# Patient Record
Sex: Male | Born: 1970 | Race: White | Hispanic: No | Smoking: Former smoker
Health system: Southern US, Community
[De-identification: ages and names within clinical notes are randomized; demographics above are authoritative.]

## PROBLEM LIST (undated history)

## (undated) DIAGNOSIS — G8929 Other chronic pain: Secondary | ICD-10-CM

## (undated) DIAGNOSIS — F32A Depression, unspecified: Secondary | ICD-10-CM

## (undated) DIAGNOSIS — M1990 Unspecified osteoarthritis, unspecified site: Secondary | ICD-10-CM

## (undated) DIAGNOSIS — K219 Gastro-esophageal reflux disease without esophagitis: Secondary | ICD-10-CM

## (undated) DIAGNOSIS — J45909 Unspecified asthma, uncomplicated: Secondary | ICD-10-CM

## (undated) DIAGNOSIS — M25512 Pain in left shoulder: Secondary | ICD-10-CM

## (undated) DIAGNOSIS — F329 Major depressive disorder, single episode, unspecified: Secondary | ICD-10-CM

## (undated) DIAGNOSIS — IMO0002 Reserved for concepts with insufficient information to code with codable children: Secondary | ICD-10-CM

## (undated) DIAGNOSIS — T7840XA Allergy, unspecified, initial encounter: Secondary | ICD-10-CM

## (undated) DIAGNOSIS — F419 Anxiety disorder, unspecified: Secondary | ICD-10-CM

## (undated) HISTORY — DX: Unspecified asthma, uncomplicated: J45.909

## (undated) HISTORY — PX: KNEE SURGERY: SHX244

## (undated) HISTORY — PX: SHOULDER SURGERY: SHX246

## (undated) HISTORY — DX: Major depressive disorder, single episode, unspecified: F32.9

## (undated) HISTORY — DX: Anxiety disorder, unspecified: F41.9

## (undated) HISTORY — DX: Gastro-esophageal reflux disease without esophagitis: K21.9

## (undated) HISTORY — DX: Unspecified osteoarthritis, unspecified site: M19.90

## (undated) HISTORY — DX: Allergy, unspecified, initial encounter: T78.40XA

## (undated) HISTORY — DX: Pain in left shoulder: M25.512

## (undated) HISTORY — DX: Other chronic pain: G89.29

## (undated) HISTORY — DX: Reserved for concepts with insufficient information to code with codable children: IMO0002

## (undated) HISTORY — DX: Depression, unspecified: F32.A

---

## 2001-07-25 ENCOUNTER — Encounter: Payer: Self-pay | Admitting: Orthopedic Surgery

## 2001-07-25 ENCOUNTER — Ambulatory Visit (HOSPITAL_COMMUNITY): Admission: RE | Admit: 2001-07-25 | Discharge: 2001-07-25 | Payer: Self-pay | Admitting: Orthopedic Surgery

## 2001-10-06 ENCOUNTER — Ambulatory Visit (HOSPITAL_BASED_OUTPATIENT_CLINIC_OR_DEPARTMENT_OTHER): Admission: RE | Admit: 2001-10-06 | Discharge: 2001-10-06 | Payer: Self-pay | Admitting: Orthopedic Surgery

## 2003-08-05 ENCOUNTER — Emergency Department (HOSPITAL_COMMUNITY): Admission: EM | Admit: 2003-08-05 | Discharge: 2003-08-05 | Payer: Self-pay | Admitting: Emergency Medicine

## 2004-12-01 ENCOUNTER — Ambulatory Visit: Payer: Self-pay | Admitting: Internal Medicine

## 2004-12-04 ENCOUNTER — Ambulatory Visit (HOSPITAL_COMMUNITY): Admission: RE | Admit: 2004-12-04 | Discharge: 2004-12-04 | Payer: Self-pay | Admitting: Internal Medicine

## 2004-12-12 ENCOUNTER — Ambulatory Visit: Payer: Self-pay | Admitting: Internal Medicine

## 2004-12-12 ENCOUNTER — Ambulatory Visit (HOSPITAL_COMMUNITY): Admission: RE | Admit: 2004-12-12 | Discharge: 2004-12-12 | Payer: Self-pay | Admitting: Internal Medicine

## 2004-12-18 ENCOUNTER — Ambulatory Visit: Payer: Self-pay | Admitting: Internal Medicine

## 2005-01-11 ENCOUNTER — Ambulatory Visit: Payer: Self-pay | Admitting: Internal Medicine

## 2005-06-29 ENCOUNTER — Ambulatory Visit: Payer: Self-pay | Admitting: Internal Medicine

## 2005-08-21 ENCOUNTER — Ambulatory Visit (HOSPITAL_COMMUNITY): Admission: RE | Admit: 2005-08-21 | Discharge: 2005-08-21 | Payer: Self-pay | Admitting: Orthopedic Surgery

## 2005-11-09 ENCOUNTER — Ambulatory Visit: Payer: Self-pay | Admitting: Internal Medicine

## 2005-11-19 ENCOUNTER — Ambulatory Visit: Payer: Self-pay | Admitting: Internal Medicine

## 2005-12-18 ENCOUNTER — Emergency Department (HOSPITAL_COMMUNITY): Admission: EM | Admit: 2005-12-18 | Discharge: 2005-12-18 | Payer: Self-pay | Admitting: Emergency Medicine

## 2006-02-26 HISTORY — PX: SHOULDER SURGERY: SHX246

## 2006-03-29 ENCOUNTER — Ambulatory Visit: Payer: Self-pay | Admitting: Internal Medicine

## 2006-04-04 ENCOUNTER — Ambulatory Visit: Payer: Self-pay | Admitting: Internal Medicine

## 2006-08-21 ENCOUNTER — Emergency Department (HOSPITAL_COMMUNITY): Admission: EM | Admit: 2006-08-21 | Discharge: 2006-08-21 | Payer: Self-pay | Admitting: Emergency Medicine

## 2006-08-26 ENCOUNTER — Emergency Department (HOSPITAL_COMMUNITY): Admission: EM | Admit: 2006-08-26 | Discharge: 2006-08-26 | Payer: Self-pay | Admitting: Emergency Medicine

## 2006-09-04 ENCOUNTER — Emergency Department (HOSPITAL_COMMUNITY): Admission: EM | Admit: 2006-09-04 | Discharge: 2006-09-04 | Payer: Self-pay | Admitting: Emergency Medicine

## 2006-11-27 ENCOUNTER — Emergency Department (HOSPITAL_COMMUNITY): Admission: EM | Admit: 2006-11-27 | Discharge: 2006-11-27 | Payer: Self-pay | Admitting: Emergency Medicine

## 2006-12-09 ENCOUNTER — Emergency Department (HOSPITAL_COMMUNITY): Admission: EM | Admit: 2006-12-09 | Discharge: 2006-12-09 | Payer: Self-pay | Admitting: Emergency Medicine

## 2006-12-19 DIAGNOSIS — R000 Tachycardia, unspecified: Secondary | ICD-10-CM

## 2006-12-19 DIAGNOSIS — S335XXA Sprain of ligaments of lumbar spine, initial encounter: Secondary | ICD-10-CM

## 2006-12-31 ENCOUNTER — Emergency Department (HOSPITAL_COMMUNITY): Admission: EM | Admit: 2006-12-31 | Discharge: 2006-12-31 | Payer: Self-pay | Admitting: Emergency Medicine

## 2006-12-31 ENCOUNTER — Encounter (INDEPENDENT_AMBULATORY_CARE_PROVIDER_SITE_OTHER): Payer: Self-pay | Admitting: *Deleted

## 2007-01-03 ENCOUNTER — Emergency Department (HOSPITAL_COMMUNITY): Admission: EM | Admit: 2007-01-03 | Discharge: 2007-01-03 | Payer: Self-pay | Admitting: Emergency Medicine

## 2007-01-03 ENCOUNTER — Ambulatory Visit: Payer: Self-pay | Admitting: Internal Medicine

## 2007-01-03 DIAGNOSIS — R070 Pain in throat: Secondary | ICD-10-CM

## 2007-01-03 DIAGNOSIS — K219 Gastro-esophageal reflux disease without esophagitis: Secondary | ICD-10-CM

## 2007-01-04 ENCOUNTER — Emergency Department (HOSPITAL_COMMUNITY): Admission: EM | Admit: 2007-01-04 | Discharge: 2007-01-04 | Payer: Self-pay | Admitting: Emergency Medicine

## 2007-01-07 ENCOUNTER — Ambulatory Visit: Payer: Self-pay | Admitting: Internal Medicine

## 2007-01-07 DIAGNOSIS — G43009 Migraine without aura, not intractable, without status migrainosus: Secondary | ICD-10-CM | POA: Insufficient documentation

## 2007-03-03 ENCOUNTER — Emergency Department (HOSPITAL_COMMUNITY): Admission: EM | Admit: 2007-03-03 | Discharge: 2007-03-03 | Payer: Self-pay | Admitting: Emergency Medicine

## 2007-03-10 ENCOUNTER — Encounter (INDEPENDENT_AMBULATORY_CARE_PROVIDER_SITE_OTHER): Payer: Self-pay | Admitting: Internal Medicine

## 2007-03-29 ENCOUNTER — Emergency Department (HOSPITAL_COMMUNITY): Admission: EM | Admit: 2007-03-29 | Discharge: 2007-03-29 | Payer: Self-pay | Admitting: Emergency Medicine

## 2007-04-28 ENCOUNTER — Emergency Department (HOSPITAL_COMMUNITY): Admission: EM | Admit: 2007-04-28 | Discharge: 2007-04-28 | Payer: Self-pay | Admitting: Emergency Medicine

## 2007-09-08 ENCOUNTER — Emergency Department (HOSPITAL_COMMUNITY): Admission: EM | Admit: 2007-09-08 | Discharge: 2007-09-08 | Payer: Self-pay | Admitting: Emergency Medicine

## 2007-11-09 ENCOUNTER — Emergency Department (HOSPITAL_COMMUNITY): Admission: EM | Admit: 2007-11-09 | Discharge: 2007-11-10 | Payer: Self-pay | Admitting: Emergency Medicine

## 2007-12-18 ENCOUNTER — Emergency Department (HOSPITAL_COMMUNITY): Admission: EM | Admit: 2007-12-18 | Discharge: 2007-12-18 | Payer: Self-pay | Admitting: Emergency Medicine

## 2007-12-23 ENCOUNTER — Emergency Department (HOSPITAL_COMMUNITY): Admission: EM | Admit: 2007-12-23 | Discharge: 2007-12-23 | Payer: Self-pay | Admitting: Emergency Medicine

## 2008-03-16 ENCOUNTER — Encounter: Admission: RE | Admit: 2008-03-16 | Discharge: 2008-04-05 | Payer: Self-pay | Admitting: Orthopedic Surgery

## 2008-04-04 ENCOUNTER — Emergency Department (HOSPITAL_COMMUNITY): Admission: EM | Admit: 2008-04-04 | Discharge: 2008-04-04 | Payer: Self-pay | Admitting: Family Medicine

## 2008-06-30 ENCOUNTER — Emergency Department (HOSPITAL_COMMUNITY): Admission: EM | Admit: 2008-06-30 | Discharge: 2008-06-30 | Payer: Self-pay | Admitting: Emergency Medicine

## 2008-08-10 ENCOUNTER — Encounter: Admission: RE | Admit: 2008-08-10 | Discharge: 2008-08-10 | Payer: Self-pay | Admitting: Gastroenterology

## 2008-08-25 ENCOUNTER — Encounter: Admission: RE | Admit: 2008-08-25 | Discharge: 2008-08-25 | Payer: Self-pay | Admitting: Gastroenterology

## 2008-12-14 ENCOUNTER — Emergency Department (HOSPITAL_COMMUNITY): Admission: EM | Admit: 2008-12-14 | Discharge: 2008-12-14 | Payer: Self-pay | Admitting: Emergency Medicine

## 2009-02-01 ENCOUNTER — Emergency Department (HOSPITAL_COMMUNITY): Admission: EM | Admit: 2009-02-01 | Discharge: 2009-02-01 | Payer: Self-pay | Admitting: Emergency Medicine

## 2009-02-21 ENCOUNTER — Emergency Department (HOSPITAL_COMMUNITY): Admission: EM | Admit: 2009-02-21 | Discharge: 2009-02-21 | Payer: Self-pay | Admitting: Emergency Medicine

## 2009-03-07 ENCOUNTER — Emergency Department (HOSPITAL_COMMUNITY): Admission: EM | Admit: 2009-03-07 | Discharge: 2009-03-07 | Payer: Self-pay | Admitting: Emergency Medicine

## 2009-03-21 ENCOUNTER — Emergency Department (HOSPITAL_COMMUNITY): Admission: EM | Admit: 2009-03-21 | Discharge: 2009-03-21 | Payer: Self-pay | Admitting: Emergency Medicine

## 2009-04-01 ENCOUNTER — Emergency Department (HOSPITAL_COMMUNITY): Admission: EM | Admit: 2009-04-01 | Discharge: 2009-04-01 | Payer: Self-pay | Admitting: Emergency Medicine

## 2009-04-01 ENCOUNTER — Encounter: Admission: RE | Admit: 2009-04-01 | Discharge: 2009-04-01 | Payer: Self-pay | Admitting: Emergency Medicine

## 2009-07-06 ENCOUNTER — Emergency Department (HOSPITAL_COMMUNITY): Admission: EM | Admit: 2009-07-06 | Discharge: 2009-07-06 | Payer: Self-pay | Admitting: Family Medicine

## 2009-07-17 ENCOUNTER — Emergency Department (HOSPITAL_COMMUNITY): Admission: EM | Admit: 2009-07-17 | Discharge: 2009-07-18 | Payer: Self-pay | Admitting: Emergency Medicine

## 2009-11-11 ENCOUNTER — Emergency Department (HOSPITAL_COMMUNITY): Admission: EM | Admit: 2009-11-11 | Discharge: 2009-11-11 | Payer: Self-pay | Admitting: Emergency Medicine

## 2009-11-29 ENCOUNTER — Ambulatory Visit: Payer: Self-pay | Admitting: Internal Medicine

## 2009-11-29 DIAGNOSIS — F418 Other specified anxiety disorders: Secondary | ICD-10-CM | POA: Insufficient documentation

## 2009-11-29 DIAGNOSIS — G4700 Insomnia, unspecified: Secondary | ICD-10-CM

## 2009-11-29 DIAGNOSIS — F410 Panic disorder [episodic paroxysmal anxiety] without agoraphobia: Secondary | ICD-10-CM

## 2009-11-29 DIAGNOSIS — IMO0002 Reserved for concepts with insufficient information to code with codable children: Secondary | ICD-10-CM

## 2009-12-06 ENCOUNTER — Telehealth (INDEPENDENT_AMBULATORY_CARE_PROVIDER_SITE_OTHER): Payer: Self-pay | Admitting: Internal Medicine

## 2010-01-03 ENCOUNTER — Ambulatory Visit: Payer: Self-pay | Admitting: Internal Medicine

## 2010-01-03 DIAGNOSIS — J069 Acute upper respiratory infection, unspecified: Secondary | ICD-10-CM | POA: Insufficient documentation

## 2010-01-03 DIAGNOSIS — I10 Essential (primary) hypertension: Secondary | ICD-10-CM | POA: Insufficient documentation

## 2010-01-05 DIAGNOSIS — R7309 Other abnormal glucose: Secondary | ICD-10-CM

## 2010-01-05 LAB — CONVERTED CEMR LAB
BUN: 20 mg/dL (ref 6–23)
CO2: 19 meq/L (ref 19–32)
Glucose, Bld: 104 mg/dL — ABNORMAL HIGH (ref 70–99)
Sodium: 137 meq/L (ref 135–145)

## 2010-01-10 ENCOUNTER — Ambulatory Visit: Payer: Self-pay | Admitting: Internal Medicine

## 2010-01-16 ENCOUNTER — Ambulatory Visit: Payer: Self-pay | Admitting: Internal Medicine

## 2010-02-02 ENCOUNTER — Ambulatory Visit: Payer: Self-pay | Admitting: Internal Medicine

## 2010-02-09 ENCOUNTER — Ambulatory Visit: Payer: Self-pay | Admitting: Internal Medicine

## 2010-02-19 ENCOUNTER — Emergency Department (HOSPITAL_COMMUNITY)
Admission: EM | Admit: 2010-02-19 | Discharge: 2010-02-19 | Payer: Self-pay | Source: Home / Self Care | Admitting: Emergency Medicine

## 2010-02-26 HISTORY — PX: COLONOSCOPY: SHX174

## 2010-02-26 HISTORY — PX: ESOPHAGOGASTRODUODENOSCOPY: SHX1529

## 2010-03-08 ENCOUNTER — Emergency Department (HOSPITAL_COMMUNITY)
Admission: EM | Admit: 2010-03-08 | Discharge: 2010-03-08 | Payer: Self-pay | Source: Home / Self Care | Admitting: Family Medicine

## 2010-03-28 NOTE — Progress Notes (Signed)
Summary: BACK STILL NO BETTER  Phone Note Call from Patient Call back at Home Phone [phone]   Reason for Call: Talk to Nurse Summary of Call: [facility]  PT. MR Spearing CALLED AND SAYS THAT HIS BACK IS STILL IN  LOT OF PAIN, AND THE MEDICAITON THAT WAS PRESCRIBED (HYDROCODONE) DID HELP, BUT HE RAN OUT YESTERDAY, AND THE CYCLOBENZABPRINE HE IS OUT.  THE REMERON THAT HE IS TAKING, HAS GOT HIM UPTIGHT AND IRRITABLE. Initial call taken by: Leodis Rains,  December 06, 2009 5:03 PM  Follow-up for Phone Call        Sent to [facility].  Dutch Quint RN  December 06, 2009 5:29 PM   Additional Follow-up for Phone Call Additional follow up Details #1::        The pain meds prescribed were a one time fill, not to be continued.  I certainly did not expect that he would  go through the meds so quickly or that he would continue to need such strong meds for this length of time.   Would be happy to refer him to PT if his pain remains so severe. If he agrees to PT, please print the order written and fax to PT If he can continue to take the Remeron, hopefully the irritability will resolve.  He should be taking it daily at bedtime. Additional Follow-up by: Julieanne Manson MD,  December 06, 2009 6:47 PM    Additional Follow-up for Phone Call Additional follow up Details #2::    Pt. called states back pain continues, advised to continue Remeron at bedtime, may use heat or ice to painful areas. May take ES Tylenol as needed. Will send referral for PT. Pt aware. Follow-up by: Gaylyn Cheers RN,  December 07, 2009 10:37 AM

## 2010-03-28 NOTE — Assessment & Plan Note (Signed)
Summary: 1 WEEK FU FOR BP CHECK, Fasting Blood Sugar for Hyperglycemia...  Nurse Visit   Vital Signs:  Patient profile:   40 year old male Pulse rate:   100 / minute Pulse rhythm:   regular Resp:     20 per minute BP sitting:   138 / 98  (left arm) Cuff size:   large  Vitals Entered By: Dutch Quint RN (January 10, 2010 9:35 AM)  Impression & Recommendations:  Problem # 1:  ELEVATED BLOOD PRESSURE WITHOUT DIAGNOSIS OF HYPERTENSION (ICD-796.2) BP still elevated 138/98 Asymptomatic To limit caffeine and avoid decongestants Return in one month for BP recheck with triage nurse.  Complete Medication List: 1)  Cyclobenzaprine Hcl 10 Mg Tabs (Cyclobenzaprine hcl) .Marland Kitchen.. 1 tab by mouth every 8 hours as needed for back pain. 2)  Hydrocodone-acetaminophen 5-500 Mg Tabs (Hydrocodone-acetaminophen) .Marland Kitchen.. 1-2 tabs by mouth every 4 hours as needed for back pain 3)  Remeron 15 Mg Tabs (Mirtazapine) .Marland Kitchen.. 1 tab by mouth at bedtime for 7 days, then increase to 2 tabs by mouth at bedtime.  Other Orders: Capillary Blood Glucose/CBG (81191)   CC:  Repeat BP and CBG.  History of Present Illness: Repeat BP -- 01/03/10 154/110.  Had consumed large amount of caffeine before visit and was taking decongestants.  States no caffeine or decongestants at this time.  Has not been taking remeron x1 week --- waiting to pick it up at pharmacy.   Patient Instructions: 1)  Reviewed with Dr. Delrae Alfred 2)  Your blood pressure is still elevated  138/98. 3)  Stay off of the decongestants, limit caffeine 4)  Return in one month for repeat blood pressure check with triage nurse. 5)  Call if anything changes or if you have any questions.   Review of Systems CV:  Complains of fatigue; States not sleeping well due to back pain, taking muscle relaxers for pain.  CC: Repeat BP and CBG Is Patient Diabetic? No Pain Assessment Patient in pain? yes     Location: lower back Intensity: 8 Type: sharp Onset of pain   about seven weeks ago, constant CBG Result 110 CBG Device ID B  Does patient need assistance? Functional Status Self care Ambulation Normal Comments does move slowly and limps due to lower back pain   Allergies: 1)  ! Nsaids 2)  ! Asa 3)  ! * Tomatoes Laboratory Results   Blood Tests     CBG Random:: 110mg /dL     Orders Added: 1)  Capillary Blood Glucose/CBG [82948] 2)  Est. Patient Level I [47829]

## 2010-03-28 NOTE — Assessment & Plan Note (Signed)
Summary: BACK PAIN//PANIC ATTACKS//MC   Vital Signs:  Patient profile:   40 year old male Weight:      244.6 pounds Temp:     99.1 degrees F Pulse rate:   103 / minute Pulse rhythm:   regular BP sitting:   126 / 90  (left arm) Cuff size:   large  Vitals Entered By: Michelle Nasuti (November 29, 2009 3:07 PM) CC: recent injury to back and shoulder very painful. ptc/o depression, panic attacks, and acid reflux Pain Assessment Patient in pain? yes      Intensity: 8  Does patient need assistance? Ambulation Normal   CC:  recent injury to back and shoulder very painful. ptc/o depression, panic attacks, and and acid reflux.  History of Present Illness: 40 yo male here to reestablish.    Concerns:  1.  Depression and anxiety:  has been going through a custody battle with x wife for 25 yo autistic son for past 3 months.  Having panic attacks 5-6 times weekly--sometimes 2-3 times daily.  Palpitations, dyspnea, phonophobia, black spots in visual field.  Headache rarely  associated--frontal--deep sharp pain behind eyes.  Has taken Effexor previously without much help and had really vivid dreams, Prozac did not help.  May have taken Zoloft in past.  Has also not taken Amitriptyline or Trazodone before either.  Never on Rozerem.  Not sleeping well--down to 3 hours nightly.  Not getting any counseling.    2.  Back pain:  trying to move a bureau over the weekend--had to hold the piece of furniture up for a period of time for his son to get a better hold.  Now with right shoulder and mid thoracic and left lumbar back pain since.  has tried Tylenol.  Unable to take NSAIDS--gives him asthma attacks.    Allergies (verified): 1)  ! Nsaids 2)  ! Asa 3)  ! * Tomatoes  Past History:  Past Surgical History: 1.  Left knee arthroscopy 08/21/05  Dr. Durene Romans 2.  Right knee arthroscopy 10/01/06  Dr. Durene Romans 3.  02/26/2008:  left shoulder surgery:  Dr. Ranell Patrick 4.  06/10/2008:  right shoulder  surgery , Dr. Ranell Patrick.  Physical Exam  General:  Obese. Lungs:  Normal respiratory effort, chest expands symmetrically. Lungs are clear to auscultation, no crackles or wheezes. Heart:  Normal rate and regular rhythm. S1 and S2 normal without gallop, murmur, click, rub or other extra sounds.  Radial pulses normal and equal. Msk:  Tender over paraspinous musculature between scapula.  Mild tenderness over spinous processes of that area and lumbar spine.  Mild tenderness left lumbar paraspinous musculature   Impression & Recommendations:  Problem # 1:  BACK STRAIN, ACUTE (ICD-847.9) Short course of muscle relaxant and pain meds. To apply heating pad for 20 minutes two times a day if helpful.  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 His updated medication list for this problem includes:    Remeron 15 Mg Tabs (Mirtazapine) .Marland Kitchen... 1 tab by mouth at bedtime for 7 days, then increase to 2 tabs by mouth at bedtime.  Orders: Psychology Referral (Psychology)  Problem # 3:  INSOMNIA (ICD-780.52) As above  Complete Medication List: 1)  Cyclobenzaprine Hcl 10 Mg Tabs (Cyclobenzaprine hcl) .Marland Kitchen.. 1 tab by mouth every 8 hours as needed for back pain. 2)  Hydrocodone-acetaminophen 5-500 Mg Tabs (Hydrocodone-acetaminophen) .Marland Kitchen.. 1-2 tabs by mouth every 4 hours as  needed for back pain 3)  Remeron 15 Mg Tabs (Mirtazapine) .Marland Kitchen.. 1 tab by mouth at bedtime for 7 days, then increase to 2 tabs by mouth at bedtime.  Patient Instructions: 1)  Referral to [facility] 2)  Follow up with Dr. Delrae Alfred in 1 month --depression, insomnia, panic attacks. 3)  Watch your food intake on the Remeron Prescriptions: REMERON 15 MG TABS (MIRTAZAPINE) 1 tab by mouth at bedtime for 7 days, then increase to 2 tabs by mouth at bedtime.  #60 x 1   Entered and Authorized by:   Julieanne Manson MD   Signed by:   Julieanne Manson MD on  11/29/2009   Method used:   Faxed to ...       [facility] (retail)       [address] [place], [place]  [address]       Ph: [phone]       Fax: [phone]   RxID:   [id number] HYDROCODONE-ACETAMINOPHEN 5-500 MG TABS (HYDROCODONE-ACETAMINOPHEN) 1-2 tabs by mouth every 4 hours as needed for back pain  #20 x 0   Entered and Authorized by:   Julieanne Manson MD   Signed by:   Julieanne Manson MD on 11/29/2009   Method used:   Print then Give to Patient   RxID:   [id number] CYCLOBENZAPRINE HCL 10 MG TABS (CYCLOBENZAPRINE HCL) 1 tab by mouth every 8 hours as needed for back pain.  #15 x 0   Entered and Authorized by:   Julieanne Manson MD   Signed by:   Julieanne Manson MD on 11/29/2009   Method used:   Print then Give to Patient   RxID:   [id number]

## 2010-03-28 NOTE — Assessment & Plan Note (Signed)
Summary: 1 MONTH FU ON DEPRESION/PANIC ATTACKS/INSOMNIA/KT   Vital Signs:  Patient profile:   40 year old male Weight:      247.25 pounds Temp:     97 degrees F oral Pulse rate:   100 / minute Pulse rhythm:   regular Resp:     24 per minute BP sitting:   154 / 110  (left arm)  Vitals Entered By: Chauncy Passy, CMA (January 03, 2010 9:19 AM)  Serial Vital Signs/Assessments:  Time      Position  BP       Pulse  Resp  Temp     By                     138/102                        [place] Shannon  CC: Pt. is here for a 1 month f/u for depression, panic attacks, and insomnia. Pt. states that he is feeling much better now that he is taking Mirtazapine.  Is Patient Diabetic? No Pain Assessment Patient in pain? yes     Location: lower back Intensity: 7 Type: sharp Onset of pain  Constant  Does patient need assistance? Functional Status Self care Ambulation Normal   CC:  Pt. is here for a 1 month f/u for depression, panic attacks, and and insomnia. Pt. states that he is feeling much better now that he is taking Mirtazapine. Marland Kitchen  History of Present Illness: 1.   Elevated blood pressure:  had 2 large mugs of coffee before coming in today.  Overall pain is definitely better.  Lower back still a problem.  Not on any other otc medication.  Using Mucinex D.  Has been told bp is up in past.  Both parents with history of hypertension.  BP was borderline at last visit (diastolic).  No chemistries to evaluate renal function in chart.   2.  Depression/panic attacks:  Doing much better, much better focused.  Feels much sharper.  Still not sleeping well secondary to back pain, but energy level way up.  "Feels normal".  Others have noted as well.  Had to cancel appt. with Marchelle Folks.  No suicidal thoughts.  Out of Mirtazapine 2 nights before   Current Medications (verified): 1)  Cyclobenzaprine Hcl 10 Mg Tabs (Cyclobenzaprine Hcl) .Marland Kitchen.. 1 Tab By Mouth Every 8 Hours As Needed For Back Pain. 2)   Hydrocodone-Acetaminophen 5-500 Mg Tabs (Hydrocodone-Acetaminophen) .Marland Kitchen.. 1-2 Tabs By Mouth Every 4 Hours As Needed For Back Pain 3)  Remeron 15 Mg Tabs (Mirtazapine) .Marland Kitchen.. 1 Tab By Mouth At Bedtime For 7 Days, Then Increase To 2 Tabs By Mouth At Bedtime.  Allergies (verified): 1)  ! Nsaids 2)  ! Asa 3)  ! * Tomatoes  Physical Exam  General:  Calm today, NAD Eyes:  No corneal or conjunctival inflammation noted. EOMI. Perrla. Funduscopic exam benign, without hemorrhages, exudates or papilledema. Vision grossly normal. Ears:  External ear exam shows no significant lesions or deformities.  Otoscopic examination reveals clear canals, tympanic membranes are intact bilaterally without bulging, retraction, inflammation or discharge. Hearing is grossly normal bilaterally. Nose:  nasal dischargemucosal pallor.   Mouth:  pharynx pink and moist.   Neck:  No deformities, masses, or tenderness noted. Lungs:  Normal respiratory effort, chest expands symmetrically. Lungs are clear to auscultation, no crackles or wheezes. Heart:  Normal rate and regular rhythm. S1 and S2 normal without  gallop, murmur, click, rub or other extra sounds.  Radial pulses normal and equal Abdomen:  Bowel sounds positive,abdomen soft and non-tender without masses, organomegaly or hernias noted.  No abdominal bruits   Impression & Recommendations:  Problem # 1:  ELEVATED BLOOD PRESSURE WITHOUT DIAGNOSIS OF HYPERTENSION (ICD-796.2) Recheck bp. Orders: T-Basic Metabolic Panel [id number]) UA Dipstick w/o Micro (manual) (09811)  Problem # 2:  URI (ICD-465.9) Pt. had fever over the weekend, but congestion and URI symptoms now improving. To avoid decongestants with bp  Problem # 3:  INSOMNIA (ICD-780.52) No improvement here, but only on 30 mg of Mirtazapine for 3 weeks. Try muscle relaxant at bedtime as well as needed for back  Problem # 4:  PANIC DISORDER (ICD-300.01) and depression much improved Rerefer to [facility] His updated medication list for this problem includes:    Remeron 15 Mg Tabs (Mirtazapine) .Marland Kitchen... 1 tab by mouth at bedtime for 7 days, then increase to 2 tabs by mouth at bedtime.  Problem # 5:  LUMBAR STRAIN (ICD-847.2) Cyclobenzaprine and weight loss  Problem # 6:  Preventive Health Care (ICD-V70.0) Flu vaccine  Complete Medication List: 1)  Cyclobenzaprine Hcl 10 Mg Tabs (Cyclobenzaprine hcl) .Marland Kitchen.. 1 tab by mouth every 8 hours as needed for back pain. 2)  Hydrocodone-acetaminophen 5-500 Mg Tabs (Hydrocodone-acetaminophen) .Marland Kitchen.. 1-2 tabs by mouth every 4 hours as needed for back pain 3)  Remeron 15 Mg Tabs (Mirtazapine) .Marland Kitchen.. 1 tab by mouth at bedtime for 7 days, then increase to 2 tabs by mouth at bedtime.  Other Orders: Flu Vaccine 33yrs + (91478) Admin 1st Vaccine (29562)  Patient Instructions: 1)  No decongestants--use only antihistamines and expectorants, cough suppressants as needed 2)  Drink lots of water. 3)  nurse visit for bp check in 1 week 4)  Follow up with Dr. Delrae Alfred in 3 months --insomnia, depression, panic disored and bp 5)  Rerefer to [facility] Prescriptions: CYCLOBENZAPRINE HCL 10 MG TABS (CYCLOBENZAPRINE HCL) 1 tab by mouth every 8 hours as needed for back pain.  #30 x 0   Entered and Authorized by:   Julieanne Manson MD   Signed by:   Julieanne Manson MD on 01/03/2010   Method used:   Print then Give to Patient   RxID:   [id number] REMERON 15 MG TABS (MIRTAZAPINE) 1 tab by mouth at bedtime for 7 days, then increase to 2 tabs by mouth at bedtime.  #60 x 6   Entered and Authorized by:   Julieanne Manson MD   Signed by:   Julieanne Manson MD on 01/03/2010   Method used:   Faxed to ...       [facility] (retail)       [address] [place], [place]  [address]       Ph: [phone]       Fax: [phone]   RxID:   [id number]    Orders Added: 1)  T-Basic Metabolic Panel  [phone] 2)  Est. Patient Level IV [16010] 3)  UA Dipstick w/o Micro (manual) [81002] 4)  Flu Vaccine 51yrs + [90658] 5)  Admin 1st Vaccine [93235]   Immunizations Administered:  Influenza Vaccine # 1:    Vaccine Type: Fluvax 3+    Site: left deltoid    Mfr: GlaxoSmithKline    Dose: 0.5 ml    Route: IM    Given by: Chauncy Passy, CMA    Exp. Date: 08/26/2010  Lot #: [id number]    VIS given: 09/20/09 version given January 03, 2010.  Flu Vaccine Consent Questions:    Do you have a history of severe allergic reactions to this vaccine? no    Any prior history of allergic reactions to egg and/or gelatin? no    Do you have a sensitivity to the preservative Thimersol? no    Do you have a past history of Guillan-Barre Syndrome? no    Do you currently have an acute febrile illness? no    Have you ever had a severe reaction to latex? no    Vaccine information given and explained to patient? yes   Immunizations Administered:  Influenza Vaccine # 1:    Vaccine Type: Fluvax 3+    Site: left deltoid    Mfr: GlaxoSmithKline    Dose: 0.5 ml    Route: IM    Given by: Chauncy Passy, CMA    Exp. Date: 08/26/2010    Lot #: [id number]    VIS given: 09/20/09 version given January 03, 2010.

## 2010-04-04 ENCOUNTER — Encounter (INDEPENDENT_AMBULATORY_CARE_PROVIDER_SITE_OTHER): Payer: Self-pay | Admitting: Internal Medicine

## 2010-04-06 ENCOUNTER — Encounter (INDEPENDENT_AMBULATORY_CARE_PROVIDER_SITE_OTHER): Payer: Self-pay | Admitting: Internal Medicine

## 2010-04-06 ENCOUNTER — Encounter: Payer: Self-pay | Admitting: Internal Medicine

## 2010-04-06 DIAGNOSIS — M25519 Pain in unspecified shoulder: Secondary | ICD-10-CM | POA: Insufficient documentation

## 2010-04-12 ENCOUNTER — Emergency Department (HOSPITAL_COMMUNITY): Payer: Medicaid Other

## 2010-04-12 ENCOUNTER — Emergency Department (HOSPITAL_COMMUNITY)
Admission: EM | Admit: 2010-04-12 | Discharge: 2010-04-13 | Disposition: A | Discharge status: Home / Self Care | Payer: Medicaid Other | Attending: Emergency Medicine | Admitting: Emergency Medicine

## 2010-04-12 ENCOUNTER — Emergency Department (HOSPITAL_COMMUNITY)
Admission: EM | Admit: 2010-04-12 | Discharge: 2010-04-12 | Disposition: A | Discharge status: Home / Self Care | Payer: Medicaid Other | Attending: Emergency Medicine | Admitting: Emergency Medicine

## 2010-04-12 DIAGNOSIS — S20219A Contusion of unspecified front wall of thorax, initial encounter: Secondary | ICD-10-CM | POA: Insufficient documentation

## 2010-04-12 DIAGNOSIS — J45909 Unspecified asthma, uncomplicated: Secondary | ICD-10-CM | POA: Insufficient documentation

## 2010-04-12 DIAGNOSIS — R071 Chest pain on breathing: Secondary | ICD-10-CM | POA: Insufficient documentation

## 2010-04-12 DIAGNOSIS — K219 Gastro-esophageal reflux disease without esophagitis: Secondary | ICD-10-CM | POA: Insufficient documentation

## 2010-04-12 DIAGNOSIS — R0789 Other chest pain: Secondary | ICD-10-CM | POA: Insufficient documentation

## 2010-04-12 DIAGNOSIS — X500XXA Overexertion from strenuous movement or load, initial encounter: Secondary | ICD-10-CM | POA: Insufficient documentation

## 2010-04-13 NOTE — Assessment & Plan Note (Signed)
Vital Signs:  Patient profile:   40 year old male Height:      65 inches Weight:      249 pounds BMI:     41.59 Temp:     98.0 degrees F oral Pulse rate:   76 / minute Pulse rhythm:   regular Resp:     18 per minute BP sitting:   130 / 100  (left arm) Cuff size:   large  Vitals Entered By: [place] Shannon (April 06, 2010 8:35 AM) CC: follow-up visit.... pt is here for left shoulder pain... Is Patient Diabetic? No Pain Assessment Patient in pain? no       Does patient need assistance? Functional Status Self care Ambulation Normal   CC:  follow-up visit.... pt is here for left shoulder pain....  History of Present Illness: 1.  Left shoulder pain:  was lifting 50 lb daughter up onto counter yesterday and felt a sudden pulling pain in mid trap/clavicle area.  Any upward motion or motion medially today is painful.  No definite swelling.  2.  Elevated Blood pressure:  No coffee or decongestants recently.  We have not started him on any meds as of yet.  Not really exercising much, though planning to start with a program for exercise and diet March 14--pt. cannot recall the facility.  States really trying to improve diet.  3.  Panic Attacks/Depression:  pt. feels he does much better on the Remeron--he feels clearer, not letting everyone walk all over him.  His sleep markedly improved on the Remeron as well.  His family has told him he is more hostile--again, he feels this is because he is standing up for himself more.  Would to get back on the med.  Allergies (verified): 1)  ! Nsaids 2)  ! Asa 3)  ! 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 Lungs:  Normal respiratory effort, chest expands symmetrically. Lungs are clear to auscultation, no crackles or wheezes. Heart:  Normal rate and regular rhythm. S1 and S2 normal without gallop, murmur, click, rub or other extra sounds.  Radial pulses normal and equal Extremities:  Unable to fully flex or abduct left shoulder-even with passive motion secondary to pain.  Tender over left trap at and over mid clavicle.  No definite swelling in the area.  Possible small contusion in area.  Mild tenderness over AC and CC joint.  Minimal tenderness over acromial bursa.   Pain with external rotation, not quite so much with internal rotation. No definite laxity of shoulder joint or AC joint   Impression & Recommendations:  Problem # 1:  ESSENTIAL HYPERTENSION (ICD-401.9) Start Metoprolol His updated medication list for this problem includes:    Metoprolol Succinate 25 Mg Xr24h-tab (Metoprolol succinate) .Marland Kitchen... 1 tab by mouth daily  Problem # 2:  DEPRESSION (ICD-311) Restart Remeron States despite family description, he has not felt as well as he does on Remeron for past 20 years. Pt. met with [facility] once--planning to call for  appt. with [facility]--familiar with office as son goes there. His updated medication list for this problem includes:    Remeron 30 Mg Tabs (Mirtazapine) .Marland Kitchen... 1 tab by mouth at bedtime  Problem # 3:  INSOMNIA (ICD-780.52) Back to Remeron.  Problem # 4:  SHOULDER PAIN, LEFT (ICD-719.41)  Not clear what his injury is at this time--check Xrays, particularly focusing on [facility] joint. His updated medication list for this problem includes:     Cyclobenzaprine Hcl 10 Mg Tabs (Cyclobenzaprine hcl) .Marland Kitchen... 1 tab by mouth every 8 hours as needed for back pain.    Hydrocodone-acetaminophen 5-500 Mg Tabs (Hydrocodone-acetaminophen) .Marland Kitchen... 1-2 tabs by mouth every 4 hours as needed for back pain  Orders: Physical Therapy Referral (PT) Diagnostic X-Ray/Fluoroscopy (Diagnostic X-Ray/Flu)  Complete Medication List: 1)  Cyclobenzaprine Hcl 10 Mg Tabs (Cyclobenzaprine hcl) .Marland Kitchen.. 1 tab by mouth every 8 hours as needed for back pain. 2)  Hydrocodone-acetaminophen 5-500 Mg Tabs (Hydrocodone-acetaminophen) .Marland Kitchen.. 1-2 tabs by mouth every 4 hours as needed for back pain 3)  Remeron 30 Mg Tabs (Mirtazapine) .Marland Kitchen.. 1 tab by mouth at bedtime 4)  Metoprolol Succinate 25 Mg Xr24h-tab (Metoprolol succinate) .Marland Kitchen.. 1 tab by mouth daily  Patient Instructions: 1)  Follow up with Dr. Delrae Alfred in 1 month --bp  2)  Call if you do not hear from PT in 1-2 weeks Prescriptions: HYDROCODONE-ACETAMINOPHEN 5-500 MG TABS (HYDROCODONE-ACETAMINOPHEN) 1-2 tabs by mouth every 4 hours as needed for back pain  #30 x 0   Entered and Authorized by:   Julieanne Manson MD   Signed by:   Julieanne Manson MD on 04/06/2010   Method used:   Print then Give to Patient   RxID:   [id number] CYCLOBENZAPRINE HCL 10 MG TABS (CYCLOBENZAPRINE HCL) 1 tab by mouth every 8 hours as needed for back pain.  #30 x 0   Entered and Authorized by:   Julieanne Manson MD   Signed by:   Julieanne Manson MD on 04/06/2010   Method used:   Print then Give to Patient   RxID:   [id number] METOPROLOL SUCCINATE 25 MG XR24H-TAB (METOPROLOL SUCCINATE) 1 tab by mouth daily  #30 x 11   Entered and Authorized by:   Julieanne Manson MD   Signed by:   Julieanne Manson MD on 04/06/2010   Method used:   Electronically to        [facility] [address] [phone]* (retail)       [address]       [place], [place]  [address]       Ph: [phone] or [phone]       Fax:  [phone]   RxID:   [id number] REMERON 30 MG TABS (MIRTAZAPINE) 1 tab by mouth at bedtime  #30 x 11   Entered and Authorized by:   Julieanne Manson MD   Signed by:   Julieanne Manson MD on 04/06/2010   Method used:   Electronically to        [facility] [address] [phone]* (retail)       [address]       [place], [place]  [address]       Ph: [phone] or [phone]       Fax: [phone]   RxID:   [id number]    Orders Added: 1)  Est. Patient Level IV [29937] 2)  Physical Therapy Referral [PT] 3)  Diagnostic X-Ray/Fluoroscopy [Diagnostic X-Ray/Flu]

## 2010-04-17 ENCOUNTER — Emergency Department (HOSPITAL_COMMUNITY): Payer: Medicaid Other

## 2010-04-17 ENCOUNTER — Emergency Department (HOSPITAL_COMMUNITY)
Admission: EM | Admit: 2010-04-17 | Discharge: 2010-04-17 | Disposition: A | Discharge status: Home / Self Care | Payer: Medicaid Other | Attending: Emergency Medicine | Admitting: Emergency Medicine

## 2010-04-17 DIAGNOSIS — R071 Chest pain on breathing: Secondary | ICD-10-CM | POA: Insufficient documentation

## 2010-04-18 ENCOUNTER — Telehealth (INDEPENDENT_AMBULATORY_CARE_PROVIDER_SITE_OTHER): Payer: Self-pay | Admitting: Internal Medicine

## 2010-04-18 ENCOUNTER — Encounter (INDEPENDENT_AMBULATORY_CARE_PROVIDER_SITE_OTHER): Payer: Self-pay | Admitting: Internal Medicine

## 2010-04-18 ENCOUNTER — Encounter: Payer: Self-pay | Admitting: Internal Medicine

## 2010-04-18 DIAGNOSIS — R071 Chest pain on breathing: Secondary | ICD-10-CM | POA: Insufficient documentation

## 2010-04-25 NOTE — Letter (Signed)
Summary: Patrick Logan  [facility]   Imported By: Arta Bruce 04/18/2010 15:24:47  _____________________________________________________________________  External Attachment:    Type:   Image     Comment:   External Document

## 2010-04-25 NOTE — Assessment & Plan Note (Signed)
Summary: Office visit   Vital Signs:  Patient profile:   40 year old male Weight:      251.4 pounds BSA:     2.18 O2 Sat:      94 % on Room air Temp:     98 degrees F oral Pulse rate:   124 / minute Pulse rhythm:   regular Resp:     28 per minute BP sitting:   130 / 90  (left arm) Cuff size:   regular  Vitals Entered By: Gaylyn Cheers RN (April 18, 2010 8:45 AM)  O2 Flow:  Room air CC: F/U from ER rt chest contusion; Seen by Dr. Ranell Patrick for shoulder pain he reccommended he be seen by pain clinic Is Patient Diabetic? No Pain Assessment Patient in pain? yes     Location: chest Intensity: 7 Type: sharp Onset of pain  Constant  Does patient need assistance? Functional Status Self care Ambulation Normal Comments no longer taking cyclobenzoprine or hydrocodone   CC:  F/U from ER rt chest contusion; Seen by Dr. Ranell Patrick for shoulder pain he reccommended he be seen by pain clinic.  History of Present Illness: 1.  Right chest wall pain:  6 days ago, pt. broke open a door that was closed with deadbolt in place, unbenownst to the pt.  He used his left shoulder to break open door.  Felt something pop in his right chest and pain gradually increased over time, though stabilized out about 3 days ago.  Has used Ibuprofen over the weekend.  Pain with deep breathing and cough.  Did go to ED the day the injury occurred and CXR did not show any broken ribs or other bony injury.  Went back to ED last night because of the pain with breathing.  CT of chest was negative for injury or infiltrate.  Has scattered nodules in bilateral lungs that are stable.  No fever.  States only started coughing with taking Ibuprofen--has known allergy to Ibuprofen.  Has been splinting chest with towel wrapped around chest  and taking deep breaths every hour.  Did get Percocet yesterday--received 10 tabs--but pt. does not like as makes him nauseated.  Prefers Vicodin.   2.  Left shoulder:  Went to [facility]--Dr. Ranell Patrick.  Has a labral tear with significant arthritis found on MRI.  Apparently, can only recommend injections to control pain.  Very concerned at how quickly his joint is deteriorating.  Current Medications (verified): 1)  Cyclobenzaprine Hcl 10 Mg Tabs (Cyclobenzaprine Hcl) .Marland Kitchen.. 1 Tab By Mouth Every 8 Hours As Needed For Back Pain. 2)  Hydrocodone-Acetaminophen 5-500 Mg Tabs (Hydrocodone-Acetaminophen) .Marland Kitchen.. 1-2 Tabs By Mouth Every 4 Hours As Needed For Back Pain 3)  Remeron 30 Mg Tabs (Mirtazapine) .Marland Kitchen.. 1 Tab By Mouth At Bedtime 4)  Metoprolol Succinate 25 Mg Xr24h-Tab (Metoprolol Succinate) .Marland Kitchen.. 1 Tab By Mouth Daily  Allergies (verified): 1)  ! Nsaids 2)  ! Asa 3)  ! * Tomatoes  Physical Exam  General:  Splinting chest with deep breathing --NAD when breathing normally Chest Wall:  Quite tender on palpation of right pectoral muscle.  No contusion, swelling or mass.  No crepitation of on palpation of chest wall. Lungs:  Normal respiratory effort, chest expands symmetrically. Lungs are clear to auscultation, no crackles or wheezes.  Good deep breaths with pillow splinting against right chest. Heart:  Normal rate and regular rhythm. S1 and S2 normal without gallop, murmur, click, rub or other extra sounds.   Impression &  Recommendations:  Problem # 1:  CHEST WALL PAIN, ACUTE (ICD-786.52) To continue splinting chest as discussed on regular basis Can try warm vs. cold packing of pec area --whichever seems to calm pain NO MORE IBUPROFEN--discussed anaphylaxis/status asthmaticus as possible result. Orders: Pulse Oximetry (single measurment) (84132)  Problem # 2:  SHOULDER PAIN, LEFT (ICD-719.41) As well as other joint pains--knees in particular His updated medication list for this problem includes:    Cyclobenzaprine Hcl 10 Mg Tabs (Cyclobenzaprine hcl) .Marland Kitchen... 1 tab by mouth every 8 hours as needed for back pain.    Hydrocodone-acetaminophen 5-500 Mg Tabs  (Hydrocodone-acetaminophen) .Marland Kitchen... 1-2 tabs by mouth every 4 hours as needed for back pain  Orders:--Pain clinic referral--will see if can get in at [facility] orth[facility] where he is already established.  Complete Medication List: 1)  Cyclobenzaprine Hcl 10 Mg Tabs (Cyclobenzaprine hcl) .Marland Kitchen.. 1 tab by mouth every 8 hours as needed for back pain. 2)  Hydrocodone-acetaminophen 5-500 Mg Tabs (Hydrocodone-acetaminophen) .Marland Kitchen.. 1-2 tabs by mouth every 4 hours as needed for back pain 3)  Remeron 30 Mg Tabs (Mirtazapine) .Marland Kitchen.. 1 tab by mouth at bedtime 4)  Metoprolol Succinate 25 Mg Xr24h-tab (Metoprolol succinate) .Marland Kitchen.. 1 tab by mouth daily  Other Orders: Pain Clinic Referral (Pain)  Patient Instructions: 1)  No ibuprofen 2)  Try warm packing right chest with heating pad before deep breathing. Prescriptions: HYDROCODONE-ACETAMINOPHEN 5-500 MG TABS (HYDROCODONE-ACETAMINOPHEN) 1-2 tabs by mouth every 4 hours as needed for back pain  #30 x 0   Entered and Authorized by:   Julieanne Manson MD   Signed by:   Julieanne Manson MD on 04/18/2010   Method used:   Print then Give to Patient   RxID:   [id number]    Orders Added: 1)  Pulse Oximetry (single measurment) [94760] 2)  Pain Clinic Referral [Pain] 3)  Est. Patient Level II [47425]  Appended Document: Office visit Also discussed with pt. need to protect body in future and not do things that are likely to cause injury.

## 2010-04-25 NOTE — Progress Notes (Signed)
Summary: Pain clinic referral  Phone Note Outgoing Call   Summary of Call: Nora--referral to pain clinic--see order.   Initial call taken by: Julieanne Manson MD,  April 18, 2010 9:33 AM  Follow-up for Phone Call        I call [facility] pt has an aappt  04-25-10 @ 2pm to see Dr Ranell Patrick . LVM to pt to call me back to give him the information   Follow-up by: Cheryll Dessert,  April 19, 2010 10:52 AM

## 2010-04-28 ENCOUNTER — Encounter (INDEPENDENT_AMBULATORY_CARE_PROVIDER_SITE_OTHER): Payer: Self-pay | Admitting: Internal Medicine

## 2010-05-09 NOTE — Letter (Signed)
Summary: [facility]   Imported By: Arta Bruce 05/01/2010 08:38:23  _____________________________________________________________________  External Attachment:    Type:   Image     Comment:   External Document

## 2010-05-14 LAB — POCT I-STAT, CHEM 8
Calcium, Ion: 1.11 mmol/L — ABNORMAL LOW (ref 1.12–1.32)
Glucose, Bld: 95 mg/dL (ref 70–99)
HCT: 48 % (ref 39.0–52.0)
Hemoglobin: 16.3 g/dL (ref 13.0–17.0)
TCO2: 28 mmol/L (ref 0–100)

## 2010-05-29 LAB — CBC
HCT: 44.3 % (ref 39.0–52.0)
Hemoglobin: 14.9 g/dL (ref 13.0–17.0)
MCHC: 33.7 g/dL (ref 30.0–36.0)
MCV: 87.6 fL (ref 78.0–100.0)
Platelets: 359 K/uL (ref 150–400)
RBC: 5.05 MIL/uL (ref 4.22–5.81)
RDW: 14.3 % (ref 11.5–15.5)
WBC: 10.4 K/uL (ref 4.0–10.5)

## 2010-05-29 LAB — TYPE AND SCREEN
ABO/RH(D): O POS
Antibody Screen: NEGATIVE

## 2010-05-29 LAB — DIFFERENTIAL
Basophils Absolute: 0 10*3/uL (ref 0.0–0.1)
Basophils Relative: 1 % (ref 0–1)
Eosinophils Relative: 1 % (ref 0–5)
Lymphocytes Relative: 35 % (ref 12–46)
Monocytes Absolute: 0.6 10*3/uL (ref 0.1–1.0)
Monocytes Relative: 5 % (ref 3–12)
Neutro Abs: 6 10*3/uL (ref 1.7–7.7)

## 2010-05-29 LAB — COMPREHENSIVE METABOLIC PANEL
AST: 23 U/L (ref 0–37)
Albumin: 3.7 g/dL (ref 3.5–5.2)
Alkaline Phosphatase: 115 U/L (ref 39–117)
BUN: 15 mg/dL (ref 6–23)
Chloride: 102 mEq/L (ref 96–112)
GFR calc Af Amer: >60 mL/min (ref >60)
Potassium: 4.4 mEq/L (ref 3.5–5.1)
Total Bilirubin: 0.4 mg/dL (ref 0.3–1.2)
Total Protein: 7.5 g/dL (ref 6.0–8.3)

## 2010-05-29 LAB — ABO/RH: ABO/RH(D): O POS

## 2010-05-29 LAB — LIPASE, BLOOD: Lipase: 31 U/L (ref 11–59)

## 2010-06-22 ENCOUNTER — Emergency Department (HOSPITAL_COMMUNITY)
Admission: EM | Admit: 2010-06-22 | Discharge: 2010-06-22 | Disposition: A | Discharge status: Home / Self Care | Payer: Medicaid Other | Attending: Emergency Medicine | Admitting: Emergency Medicine

## 2010-06-22 DIAGNOSIS — S40019A Contusion of unspecified shoulder, initial encounter: Secondary | ICD-10-CM | POA: Insufficient documentation

## 2010-06-22 DIAGNOSIS — M25519 Pain in unspecified shoulder: Secondary | ICD-10-CM | POA: Insufficient documentation

## 2010-06-22 DIAGNOSIS — X58XXXA Exposure to other specified factors, initial encounter: Secondary | ICD-10-CM | POA: Insufficient documentation

## 2010-07-03 ENCOUNTER — Emergency Department (HOSPITAL_COMMUNITY)
Admission: EM | Admit: 2010-07-03 | Discharge: 2010-07-03 | Discharge status: Against Medical Advice | Payer: Medicaid Other | Attending: Emergency Medicine | Admitting: Emergency Medicine

## 2010-07-03 ENCOUNTER — Emergency Department (HOSPITAL_COMMUNITY): Payer: Medicaid Other

## 2010-07-03 ENCOUNTER — Inpatient Hospital Stay (HOSPITAL_COMMUNITY)
Admission: RE | Admit: 2010-07-03 | Discharge: 2010-07-03 | Disposition: A | Discharge status: Home / Self Care | Payer: Self-pay | Source: Ambulatory Visit | Attending: Family Medicine | Admitting: Family Medicine

## 2010-07-03 DIAGNOSIS — M25519 Pain in unspecified shoulder: Secondary | ICD-10-CM | POA: Insufficient documentation

## 2010-07-14 NOTE — Op Note (Signed)
NAME:  MUKHTAR, SHAMS                          ACCOUNT NO.:  [id number]   MEDICAL RECORD NO.:  [id number]                   PATIENT TYPE:  UT   LOCATION:                                       FACILITY:  [facility]   PHYSICIAN:  John L. Rendall III, M.D.           DATE OF BIRTH:  03-30-1970   DATE OF PROCEDURE:  10/06/2001  DATE OF DISCHARGE:  07/25/2001                                 OPERATIVE REPORT   PREOPERATIVE DIAGNOSES:  Glenoid labrum tear, left shoulder.   POSTOPERATIVE DIAGNOSES:  Glenoid labrum tear, left shoulder.   OPERATION PERFORMED:  Arthroscopic debridement of glenoid labrum tear plus  Bankart repair of Bankart lesion, left shoulder.   SURGEON:  John L. Rendall, M.D.   ANESTHESIA:  General.   INDICATIONS FOR PROCEDURE:  Chronic left shoulder pain with MRI positive for  glenoid labral tear.   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  Posteriorly there was a small bucket handle tear of the labrum but it is  largely intact.  The glenoid looks normal.  Manipulation under anesthesia,  prior to looking in with the arthroscope did not reveal instability or  dislocation.   JUSTIFICATION FOR OUTPATIENT SETTING:  Inpatient not required.   DESCRIPTION OF PROCEDURE:  Under general anesthesia, the patient was placed  in the left lateral decubitus position on the bean bag and the arm was  suspended with a fishing pole shoulder holder.  An abducted 40 degrees,  forward flexed 30 degree position was maintained with 15 pounds of traction.  The land marks were marked out after routine prep and drape.  Posterior  entry into the glenohumeral joint was made and by Wissinger rod technique,  an anterior entry portal was made as well.  Diagnostic arthroscopy now  revealed a posterior glenoid labral tear and an anterior inferior arthritic  change on the edges of the glenoid and evidence  of a complete labral  detachment anteriorly and inferiorly.  A Statak anchor was obtained after  first debriding the inner margin of the glenoid with an intra-articular Cuda  shaver.  The guidewire was drilled into the glenoid and overdrilled and the  Statak anchor was then used grasping labrum and a large portion of the  anterior capsule.  With this in place, it was not possible to place a second  Statak anchor.  The anchor itself was not so large as to impinge on the  humeral head in its current position.  At this point, the shoulder was  irrigated with saline.  Puncture was closed with 4-0 nylon and a sterile  compression bandage was applied.  The patient was then transferred to  recovery in good condition.  John L. Dorothyann Gibbs, M.D.    Renato Gails  D:  10/06/2001  T:  10/08/2001  Job:  [id number]

## 2010-07-14 NOTE — Op Note (Signed)
[name], [name]                ACCOUNT NO.:  [id number]   MEDICAL RECORD NO.:  [id number]          PATIENT TYPE:  AMB   LOCATION:  DAY                          FACILITY:  [facility]   PHYSICIAN:  Madlyn Frankel. Charlann Boxer, M.D.  DATE OF BIRTH:  October 25, 1970   DATE OF PROCEDURE:  08/21/2005  DATE OF DISCHARGE:                                 OPERATIVE REPORT   PREOPERATIVE DIAGNOSIS:  Left knee medial meniscal tear associated with  chondromalacia of the patella.   POSTOPERATIVE DIAGNOSIS/FINDINGS:  1.  Complex tear to the mid body posterior horn medial meniscus.  2.  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  3.  Synovitis.   PROCEDURES:  Left knee diagnostic and operative arthroscopy with medial  meniscectomy, chondroplasty to the patellofemoral compartment and limited  synovectomy, three procedures in total.   SURGEON:  Madlyn Frankel. Charlann Boxer, M.D.   ASSISTANT:  None.   ANESTHESIA:  General plus local.   COMPLICATIONS:  None.   INDICATIONS:  Patrick Logan is a 40 year old male who presented to the office on  referral for evaluation of medial meniscal tear by MRI. He had had  persistent discomfort that had failed conservative measures of rest and anti-  inflammatories. He had constant aggravation. We reviewed the risks and  benefits of nonoperative versus operative intervention and he wished to  proceed with operative arthroscopy. The risks and benefits were discussed  including infection, DVT, persistent discomfort and the chin potential for  degenerative changes down the road. We also reviewed repair versus  debridement and the implications of both.  Consent was obtained.   PROCEDURE IN DETAIL:  The patient was brought to the operative theater.  Once adequate anesthesia and preoperative antibiotics, 2 grams of Ancef,  were administered the patient was positioned supine with the left leg in a  leg holder. The left lower extremity was  then prepped and draped in a  sterile fashion. Standard inferomedial, superomedial, and inferolateral  portals were utilized.  Diagnostic evaluation of the knee revealed the above-  noted findings including probe examination of the medial meniscus and flap  tears.  3.5 Kuda shavers as well as biting baskets were utilized for  debridement of the medial meniscus. A 3.5 shaver was utilized for contouring  of the meniscus as well as debridement of the patellofemoral compartment.  Further debridement including this limited synovectomy revealed the  fissuring to this femoral trochlea which was debrided back to stable  boundaries.  No microfracture was carried out as this was not a planned  procedure and has mixed results in this region.  Following this the knee was  reexamined to make sure there was no evidence of any other loose fragments  of cartilage.  The knee was then irrigated, drained, suctioned with the  shaver.  The portals were then reapproximated using 4-0 nylon.  The knee was  injected with 0.25% Marcaine with epinephrine.  Please note that the portal  sites were injected with  1% lidocaine with epinephrine to begin the case.  Once the knee was injected  at the end, it was dressed sterilely with Adaptic dressing sponges and a  bulky sterile dressing.  He was brought to the recovery room with ice on his  knee in stable condition, extubated without complication.      Madlyn Frankel Charlann Boxer, M.D.  Electronically Signed     MDO/MEDQ  D:  08/21/2005  T:  08/21/2005  Job:  [id number]

## 2010-09-10 ENCOUNTER — Emergency Department (HOSPITAL_COMMUNITY)
Admission: EM | Admit: 2010-09-10 | Discharge: 2010-09-10 | Disposition: A | Discharge status: Home / Self Care | Payer: Medicaid Other | Attending: Emergency Medicine | Admitting: Emergency Medicine

## 2010-09-10 DIAGNOSIS — R220 Localized swelling, mass and lump, head: Secondary | ICD-10-CM | POA: Insufficient documentation

## 2010-09-10 DIAGNOSIS — K089 Disorder of teeth and supporting structures, unspecified: Secondary | ICD-10-CM | POA: Insufficient documentation

## 2010-09-14 ENCOUNTER — Emergency Department (HOSPITAL_COMMUNITY)
Admission: EM | Admit: 2010-09-14 | Discharge: 2010-09-14 | Disposition: A | Discharge status: Home / Self Care | Payer: Medicaid Other | Attending: Emergency Medicine | Admitting: Emergency Medicine

## 2010-09-14 DIAGNOSIS — K089 Disorder of teeth and supporting structures, unspecified: Secondary | ICD-10-CM | POA: Insufficient documentation

## 2010-09-14 DIAGNOSIS — R220 Localized swelling, mass and lump, head: Secondary | ICD-10-CM | POA: Insufficient documentation

## 2010-09-14 DIAGNOSIS — K029 Dental caries, unspecified: Secondary | ICD-10-CM | POA: Insufficient documentation

## 2010-09-14 DIAGNOSIS — R221 Localized swelling, mass and lump, neck: Secondary | ICD-10-CM | POA: Insufficient documentation
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
## 2010-11-28 ENCOUNTER — Inpatient Hospital Stay (INDEPENDENT_AMBULATORY_CARE_PROVIDER_SITE_OTHER)
Admission: RE | Admit: 2010-11-28 | Discharge: 2010-11-28 | Disposition: A | Discharge status: Home / Self Care | Payer: Medicaid Other | Source: Ambulatory Visit | Attending: Family Medicine | Admitting: Family Medicine

## 2010-11-28 DIAGNOSIS — M79609 Pain in unspecified limb: Secondary | ICD-10-CM

## 2010-11-28 LAB — DIFFERENTIAL
Eosinophils Absolute: 0.1
Eosinophils Relative: 1
Lymphocytes Relative: 36
Lymphs Abs: 3.9
Monocytes Absolute: 0.7
Monocytes Relative: 6

## 2010-11-28 LAB — RAPID URINE DRUG SCREEN, HOSP PERFORMED
Amphetamines: NOT DETECTED
Barbiturates: NOT DETECTED
Cocaine: POSITIVE — AB
Opiates: POSITIVE — AB

## 2010-11-28 LAB — COMPREHENSIVE METABOLIC PANEL
ALT: 33
AST: 24
Albumin: 3.8
Calcium: 9.3
Creatinine, Ser: 1.17
GFR calc Af Amer: >60
GFR calc non Af Amer: >60
Sodium: 138
Total Protein: 6.9

## 2010-11-28 LAB — CK TOTAL AND CKMB (NOT AT ARMC)
Relative Index: 1
Total CK: 105

## 2010-11-28 LAB — URINALYSIS, ROUTINE W REFLEX MICROSCOPIC
Hgb urine dipstick: NEGATIVE
Specific Gravity, Urine: 1.028
Urobilinogen, UA: 0.2
pH: 6

## 2010-11-28 LAB — CBC
MCV: 88.1
Platelets: 328
RBC: 5.07
WBC: 10.8 — ABNORMAL HIGH

## 2010-11-28 LAB — D-DIMER, QUANTITATIVE: D-Dimer, Quant: 0.26

## 2010-12-07 LAB — URINALYSIS, ROUTINE W REFLEX MICROSCOPIC
Hgb urine dipstick: NEGATIVE
Ketones, ur: NEGATIVE
Specific Gravity, Urine: >1.046 — ABNORMAL HIGH
Urobilinogen, UA: 1
pH: 5

## 2010-12-07 LAB — BASIC METABOLIC PANEL
CO2: 25
Glucose, Bld: 102 — ABNORMAL HIGH
Potassium: 4
Sodium: 136

## 2010-12-07 LAB — CBC
HCT: 45.3
Hemoglobin: 15
MCHC: 33.2
RDW: 14.1 — ABNORMAL HIGH

## 2010-12-07 LAB — DIFFERENTIAL
Basophils Absolute: 0.1
Basophils Relative: 1
Eosinophils Absolute: 0.2
Eosinophils Relative: 2
Monocytes Absolute: 0.7

## 2012-01-28 ENCOUNTER — Ambulatory Visit (INDEPENDENT_AMBULATORY_CARE_PROVIDER_SITE_OTHER): Payer: Self-pay | Admitting: Family Medicine

## 2012-01-28 ENCOUNTER — Encounter: Payer: Self-pay | Admitting: Family Medicine

## 2012-01-28 VITALS — BP 144/90 | HR 121 | Temp 98.7°F | Resp 18 | Ht 67.0 in | Wt 253.6 lb

## 2012-01-28 DIAGNOSIS — R030 Elevated blood-pressure reading, without diagnosis of hypertension: Secondary | ICD-10-CM

## 2012-01-28 DIAGNOSIS — J45909 Unspecified asthma, uncomplicated: Secondary | ICD-10-CM

## 2012-01-28 DIAGNOSIS — Z01818 Encounter for other preprocedural examination: Secondary | ICD-10-CM

## 2012-01-28 DIAGNOSIS — R000 Tachycardia, unspecified: Secondary | ICD-10-CM

## 2012-01-28 LAB — CBC
Hemoglobin: 16 g/dL (ref 13.0–17.0)
RBC: 5.37 MIL/uL (ref 4.22–5.81)
WBC: 10.7 10*3/uL — ABNORMAL HIGH (ref 4.0–10.5)

## 2012-01-28 MED ORDER — ALBUTEROL SULFATE HFA 108 (90 BASE) MCG/ACT IN AERS: 2.0000 | INHALATION_SPRAY | Freq: Four times a day (QID) | RESPIRATORY_TRACT | Status: DC | PRN

## 2012-01-28 NOTE — Patient Instructions (Addendum)
Your should receive a call or letter about your lab results within the next week to 10 days.  These will determine if further evaluation will be needed prior to medical clearance for your shoulder surgery. If you do have a flair of your asthma symptoms - you can use the inhaler as prescribed.  If you have to use it more than 3 days in a row or more than 3 times a day - return to clinic for evaluation.  Return to the clinic or go to the nearest emergency room if any of your symptoms worsen or new symptoms occur.
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
## 2012-01-29 LAB — TSH: TSH: 0.765 u[IU]/mL (ref 0.350–4.500)

## 2012-01-29 LAB — COMPREHENSIVE METABOLIC PANEL
Albumin: 4.4 g/dL (ref 3.5–5.2)
BUN: 23 mg/dL (ref 6–23)
CO2: 23 mEq/L (ref 19–32)
Calcium: 10.1 mg/dL (ref 8.4–10.5)
Chloride: 105 mEq/L (ref 96–112)
Glucose, Bld: 106 mg/dL — ABNORMAL HIGH (ref 70–99)
Potassium: 4.7 mEq/L (ref 3.5–5.3)

## 2012-02-04 ENCOUNTER — Other Ambulatory Visit: Payer: Self-pay | Admitting: Family Medicine

## 2012-02-04 DIAGNOSIS — D72829 Elevated white blood cell count, unspecified: Secondary | ICD-10-CM

## 2012-02-04 DIAGNOSIS — R739 Hyperglycemia, unspecified: Secondary | ICD-10-CM

## 2012-02-14 ENCOUNTER — Telehealth: Payer: Self-pay

## 2012-02-14 ENCOUNTER — Other Ambulatory Visit (INDEPENDENT_AMBULATORY_CARE_PROVIDER_SITE_OTHER): Payer: Self-pay | Admitting: Family Medicine

## 2012-02-14 DIAGNOSIS — R739 Hyperglycemia, unspecified: Secondary | ICD-10-CM

## 2012-02-14 DIAGNOSIS — R7309 Other abnormal glucose: Secondary | ICD-10-CM

## 2012-02-14 DIAGNOSIS — D72829 Elevated white blood cell count, unspecified: Secondary | ICD-10-CM

## 2012-02-14 LAB — BASIC METABOLIC PANEL
BUN: 24 mg/dL — ABNORMAL HIGH (ref 6–23)
Chloride: 102 mEq/L (ref 96–112)
Glucose, Bld: 101 mg/dL — ABNORMAL HIGH (ref 70–99)
Potassium: 4.9 mEq/L (ref 3.5–5.3)
Sodium: 138 mEq/L (ref 135–145)

## 2012-02-14 LAB — CBC
HCT: 44.4 % (ref 39.0–52.0)
Hemoglobin: 15.6 g/dL (ref 13.0–17.0)
RDW: 14.6 % (ref 11.5–15.5)
WBC: 8.8 10*3/uL (ref 4.0–10.5)

## 2012-02-14 NOTE — Telephone Encounter (Signed)
Message copied by Johnnette Litter on Thu Feb 14, 2012  2:02 PM ------      Message from: [place], [place] R      Created: Tue Feb 12, 2012  3:12 PM       Can you please call patient and check to see if he is planning on obtaining the lab only visit in follow up of his prior office visit?  (orders already in Epic).  I have his paperwork for preoperative clearance, but would like to see those labs first to help determine this.  Let me know if there is anything I can do.      Thanks.

## 2012-02-14 NOTE — Progress Notes (Signed)
Pt here for labs only. 

## 2012-02-14 NOTE — Telephone Encounter (Signed)
Pt came in today and had his blood drawn.

## 2012-02-15 NOTE — Progress Notes (Signed)
02/15/12:    Repeat CBC ok, repeat BMP with borderline glucose. 2nd md review of prior EKG, no concerning findings.  Letter faxed to Dr. Ranell Patrick for total shoulder surgery - cleared with recommendations of monitoring BP, HR as both borderline high.  Also borderline glucose that can be followed up outpatient.  Hx of asthma - but has inhaler if needed, peak flow ok in office. Patient called with this information.

## 2012-03-11 ENCOUNTER — Other Ambulatory Visit: Payer: Self-pay | Admitting: Physician Assistant

## 2012-03-12 ENCOUNTER — Encounter (HOSPITAL_COMMUNITY): Payer: Self-pay | Admitting: Pharmacy Technician

## 2012-03-18 ENCOUNTER — Encounter (HOSPITAL_COMMUNITY)
Admission: RE | Admit: 2012-03-18 | Discharge: 2012-03-18 | Disposition: A | Discharge status: Home / Self Care | Payer: Medicaid Other | Source: Ambulatory Visit | Attending: Orthopedic Surgery | Admitting: Orthopedic Surgery

## 2012-03-18 ENCOUNTER — Encounter (HOSPITAL_COMMUNITY): Payer: Self-pay

## 2012-03-18 LAB — CBC
HCT: 45.1 % (ref 39.0–52.0)
MCHC: 34.6 g/dL (ref 30.0–36.0)
MCV: 87.6 fL (ref 78.0–100.0)
RDW: 13.9 % (ref 11.5–15.5)

## 2012-03-18 LAB — BASIC METABOLIC PANEL
BUN: 21 mg/dL (ref 6–23)
CO2: 26 mEq/L (ref 19–32)
Chloride: 102 mEq/L (ref 96–112)
Creatinine, Ser: 0.97 mg/dL (ref 0.50–1.35)

## 2012-03-18 LAB — TYPE AND SCREEN

## 2012-03-18 MED ORDER — CHLORHEXIDINE GLUCONATE 4 % EX LIQD: 60.0000 mL | Freq: Once | CUTANEOUS | Status: DC

## 2012-03-18 NOTE — H&P (Signed)
  Patrick Logan is an 42 y.o. male.    Chief Complaint: left Shoulder avascular necrosis and pain   HPI: Pt is a 41 y.o. male complaining of left shoulder pain for multiple years. Pain had continually increased since the beginning. 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 Pt has tried various conservative treatments which have failed to alleviate their symptoms, including shots and medications. Various options are discussed with the patient. Risks, benefits and expectations were discussed with the patient. Patient understand the risks, benefits and expectations and wishes to proceed with surgery.   PCP:  No primary provider on file.  D/C Plans:  Home with HHPT  PMH: Past Medical History  Diagnosis Date  . Allergy   . Arthritis   . GERD (gastroesophageal reflux disease)   . Depression   . Ulcer   . Asthma   . Anxiety     PSH: Past Surgical History  Procedure Date  . Knee surgery     R 2004  and L 2008  . Shoulder surgery 2008    both     Social History:  reports that he has been smoking Cigarettes.  He has a 17.5 pack-year smoking history. He does not have any smokeless tobacco history on file. He reports that he does not drink alcohol or use illicit drugs.  Allergies:  Allergies  Allergen Reactions  . Aspirin     REACTION: bronchospasm  . Nsaids     REACTION: bronchospasm    Medications: No current facility-administered medications for this encounter.   Current Outpatient Prescriptions  Medication Sig Dispense Refill  . albuterol (PROVENTIL HFA;VENTOLIN HFA) 108 (90 BASE) MCG/ACT inhaler Inhale 2 puffs into the lungs every 6 (six) hours as needed for wheezing.  1 Inhaler  0  . Ephedrine-Guaifenesin (BRONCHIAL ASTHMA RELIEF PO) Take 2 tablets by mouth daily as needed. For asthma      . oxyCODONE-acetaminophen (PERCOCET/ROXICET) 5-325 MG per tablet Take 1 tablet by mouth every 4 (four) hours as needed. For pain      . traMADol (ULTRAM) 50 MG  tablet Take 50 mg by mouth every 6 (six) hours as needed. For pain        No results found for this or any previous visit (from the past 48 hour(s)). No results found.  ROS: Pain with rom of left shoulder otherwise negative ros  Physical Exam: BP:   142/90  ;  HR:   82  ; Resp:   12  : Cervical spine shows full rom without tenderness Left shoulder: moderately restricted rom due to pain  nv intact distally No rashes or edema Chest: active breath sounds bilaterally with no wheeze rhonchi or rales Heart regular rate and rhythm, no murmurs Abd: non tender non distended No rashes or edema  Normal heel toe gait Normal reflexes  Assessment/Plan Assessment: left shoulder avascular necrosis  Plan: Patient will undergo a left shoulder hemi arthroplasty vs total shoulder arthroplasty by Dr. Ranell Patrick at [facility]. Risks benefits and expectations were discussed with the patient. Patient understand risks, benefits and expectations and wishes to proceed.

## 2012-03-18 NOTE — Pre-Procedure Instructions (Signed)
Patrick Logan  03/18/2012   Your procedure is scheduled on:  Friday March 21, 2012  Report to [facility] at 0830 AM.  Call this number if you have problems the morning of surgery: [phone]   Remember:   Do not eat food or drink liquids after midnight.   Take these medicines the morning of surgery with A SIP OF WATER: Bring inhaler, pain med if needed   Do not wear jewelry.  Do not wear lotion. You may wear deodorant.   Men may shave face and neck.  Do not bring valuables to the hospital.  Contacts, dentures or bridgework may not be worn into surgery.  Leave suitcase in the car. After surgery it may be brought to your room.  For patients admitted to the hospital, checkout time is 11:00 AM the day of  discharge.   Patients discharged the day of surgery will not be allowed to drive  home.    Special Instructions: Incentive Spirometry - Practice and bring it with you on the day of surgery. Shower using CHG 2 nights before surgery and the night before surgery.  If you shower the day of surgery use CHG.  Use special wash - you have one bottle of CHG for all showers.  You should use approximately 1/3 of the bottle for each shower.   Please read over the following fact sheets that you were given: Pain Booklet, Coughing and Deep Breathing, Blood Transfusion Information, MRSA Information and Surgical Site Infection Prevention

## 2012-03-19 NOTE — Consult Note (Signed)
Anesthesia chart review:  Patient is a 42 year old male scheduled for left total shoulder arthroplasty versus hemiarthroplasty on 03/12/11 by Dr. Ranell Patrick.  History includes obesity, smoking, asthma, depression, anxiety, arthritis, GERD.  PCP is Dr. Meredith Staggers who cleared patient for surgery (see clearance note under Health History tab in chart.)  EKG on 03/18/12 showed ST @ 108 bpm, LAD, incomplete right BBB.  CXR on 03/18/12 showed no acute abnormality.  Pre-operative lab noted.  Patient has been cleared by his PCP.  Anticipate patient can proceed as planned from an anesthesia standpoint.  He will be evaluated by his assigned anesthesiologist on the day of surgery.  Shonna Chock, PA-C 03/19/12 1256

## 2012-03-20 MED ORDER — CEFAZOLIN SODIUM-DEXTROSE 2-3 GM-% IV SOLR
2.0000 g | INTRAVENOUS | Status: AC
  Administered 2012-03-21: 2 g via INTRAVENOUS
  Filled 2012-03-20: qty 50

## 2012-03-21 ENCOUNTER — Observation Stay (HOSPITAL_COMMUNITY): Payer: Medicaid Other

## 2012-03-21 ENCOUNTER — Ambulatory Visit (HOSPITAL_COMMUNITY): Payer: Medicaid Other | Admitting: Vascular Surgery

## 2012-03-21 ENCOUNTER — Encounter (HOSPITAL_COMMUNITY)
Admission: RE | Disposition: A | Discharge status: Home / Self Care | Payer: Self-pay | Source: Ambulatory Visit | Attending: Orthopedic Surgery

## 2012-03-21 ENCOUNTER — Encounter (HOSPITAL_COMMUNITY): Payer: Self-pay | Admitting: Vascular Surgery

## 2012-03-21 ENCOUNTER — Observation Stay (HOSPITAL_COMMUNITY)
Admission: RE | Admit: 2012-03-21 | Discharge: 2012-03-24 | Disposition: A | Discharge status: Home / Self Care | Payer: Medicaid Other | Source: Ambulatory Visit | Attending: Orthopedic Surgery | Admitting: Orthopedic Surgery

## 2012-03-21 DIAGNOSIS — M19219 Secondary osteoarthritis, unspecified shoulder: Principal | ICD-10-CM | POA: Insufficient documentation

## 2012-03-21 DIAGNOSIS — Z01812 Encounter for preprocedural laboratory examination: Secondary | ICD-10-CM | POA: Insufficient documentation

## 2012-03-21 DIAGNOSIS — M87029 Idiopathic aseptic necrosis of unspecified humerus: Secondary | ICD-10-CM | POA: Insufficient documentation

## 2012-03-21 DIAGNOSIS — J45909 Unspecified asthma, uncomplicated: Secondary | ICD-10-CM | POA: Insufficient documentation

## 2012-03-21 DIAGNOSIS — Z01810 Encounter for preprocedural cardiovascular examination: Secondary | ICD-10-CM | POA: Insufficient documentation

## 2012-03-21 DIAGNOSIS — Z01818 Encounter for other preprocedural examination: Secondary | ICD-10-CM | POA: Insufficient documentation

## 2012-03-21 DIAGNOSIS — M19019 Primary osteoarthritis, unspecified shoulder: Secondary | ICD-10-CM | POA: Diagnosis present

## 2012-03-21 DIAGNOSIS — I10 Essential (primary) hypertension: Secondary | ICD-10-CM | POA: Insufficient documentation

## 2012-03-21 DIAGNOSIS — Z23 Encounter for immunization: Secondary | ICD-10-CM | POA: Insufficient documentation

## 2012-03-21 HISTORY — PX: TOTAL SHOULDER ARTHROPLASTY: SHX126

## 2012-03-21 HISTORY — PX: TOTAL SHOULDER REPLACEMENT: SUR1217

## 2012-03-21 SURGERY — ARTHROPLASTY, SHOULDER, TOTAL
Anesthesia: Regional | Site: Shoulder | Laterality: Left | Wound class: Clean

## 2012-03-21 MED ORDER — SODIUM CHLORIDE 0.9 % IV SOLN
INTRAVENOUS | Status: DC
  Administered 2012-03-23: 01:00:00 via INTRAVENOUS

## 2012-03-21 MED ORDER — LIDOCAINE HCL (CARDIAC) 20 MG/ML IV SOLN
INTRAVENOUS | Status: DC | PRN
  Administered 2012-03-21: 20 mg via INTRAVENOUS

## 2012-03-21 MED ORDER — THROMBIN 5000 UNITS EX SOLR
CUTANEOUS | Status: AC
  Filled 2012-03-21: qty 5000

## 2012-03-21 MED ORDER — SUCCINYLCHOLINE CHLORIDE 20 MG/ML IJ SOLN
INTRAMUSCULAR | Status: DC | PRN
Start: 2012-03-21 — End: 2012-03-21
  Administered 2012-03-21: 100 mg via INTRAVENOUS

## 2012-03-21 MED ORDER — METHOCARBAMOL 500 MG PO TABS
500.0000 mg | ORAL_TABLET | Freq: Four times a day (QID) | ORAL | Status: DC | PRN
  Administered 2012-03-21 – 2012-03-24 (×9): 500 mg via ORAL
  Filled 2012-03-21 (×10): qty 1

## 2012-03-21 MED ORDER — ONDANSETRON HCL 4 MG PO TABS: 4.0000 mg | ORAL_TABLET | Freq: Four times a day (QID) | ORAL | Status: DC | PRN

## 2012-03-21 MED ORDER — ONDANSETRON HCL 4 MG/2ML IJ SOLN: 4.0000 mg | Freq: Four times a day (QID) | INTRAMUSCULAR | Status: DC | PRN

## 2012-03-21 MED ORDER — ACETAMINOPHEN 10 MG/ML IV SOLN
1000.0000 mg | Freq: Once | INTRAVENOUS | Status: AC
  Administered 2012-03-21: 1000 mg via INTRAVENOUS
  Filled 2012-03-21: qty 100

## 2012-03-21 MED ORDER — PNEUMOCOCCAL VAC POLYVALENT 25 MCG/0.5ML IJ INJ
0.5000 mL | INJECTION | INTRAMUSCULAR | Status: AC
  Administered 2012-03-22: 0.5 mL via INTRAMUSCULAR
  Filled 2012-03-21: qty 0.5

## 2012-03-21 MED ORDER — METHOCARBAMOL 100 MG/ML IJ SOLN
500.0000 mg | INTRAVENOUS | Status: AC
  Administered 2012-03-21: 500 mg via INTRAVENOUS
  Filled 2012-03-21: qty 5

## 2012-03-21 MED ORDER — MEPERIDINE HCL 25 MG/ML IJ SOLN: 6.2500 mg | INTRAMUSCULAR | Status: DC | PRN

## 2012-03-21 MED ORDER — ONDANSETRON HCL 4 MG/2ML IJ SOLN
INTRAMUSCULAR | Status: DC | PRN
  Administered 2012-03-21: 4 mg via INTRAVENOUS

## 2012-03-21 MED ORDER — OXYCODONE-ACETAMINOPHEN 5-325 MG PO TABS
1.0000 | ORAL_TABLET | ORAL | Status: DC | PRN
  Administered 2012-03-21 – 2012-03-22 (×4): 1 via ORAL
  Filled 2012-03-21 (×4): qty 1

## 2012-03-21 MED ORDER — HYDROMORPHONE HCL PF 1 MG/ML IJ SOLN
0.5000 mg | INTRAMUSCULAR | Status: DC | PRN
  Administered 2012-03-21 – 2012-03-24 (×26): 1 mg via INTRAVENOUS
  Filled 2012-03-21 (×27): qty 1

## 2012-03-21 MED ORDER — METOCLOPRAMIDE HCL 10 MG PO TABS: 5.0000 mg | ORAL_TABLET | Freq: Three times a day (TID) | ORAL | Status: DC | PRN

## 2012-03-21 MED ORDER — OXYCODONE HCL 5 MG/5ML PO SOLN: 5.0000 mg | Freq: Once | ORAL | Status: DC | PRN

## 2012-03-21 MED ORDER — HYDROMORPHONE HCL PF 1 MG/ML IJ SOLN
0.2500 mg | INTRAMUSCULAR | Status: DC | PRN
  Administered 2012-03-21 (×2): 0.5 mg via INTRAVENOUS

## 2012-03-21 MED ORDER — METHOCARBAMOL 500 MG PO TABS: 500.0000 mg | ORAL_TABLET | Freq: Three times a day (TID) | ORAL | Status: DC | PRN

## 2012-03-21 MED ORDER — MIDAZOLAM HCL 2 MG/2ML IJ SOLN
INTRAMUSCULAR | Status: AC
  Filled 2012-03-21: qty 2

## 2012-03-21 MED ORDER — PROPOFOL 10 MG/ML IV BOLUS
INTRAVENOUS | Status: DC | PRN
  Administered 2012-03-21: 20 mg via INTRAVENOUS
  Administered 2012-03-21: 30 mg via INTRAVENOUS
  Administered 2012-03-21: 200 mg via INTRAVENOUS

## 2012-03-21 MED ORDER — SODIUM CHLORIDE 0.9 % IR SOLN
Status: DC | PRN
  Administered 2012-03-21: 1000 mL

## 2012-03-21 MED ORDER — PROMETHAZINE HCL 25 MG/ML IJ SOLN: 6.2500 mg | INTRAMUSCULAR | Status: DC | PRN

## 2012-03-21 MED ORDER — ALBUTEROL SULFATE HFA 108 (90 BASE) MCG/ACT IN AERS
2.0000 | INHALATION_SPRAY | Freq: Four times a day (QID) | RESPIRATORY_TRACT | Status: DC | PRN
  Filled 2012-03-21: qty 6.7

## 2012-03-21 MED ORDER — OXYCODONE-ACETAMINOPHEN 5-325 MG PO TABS: 1.0000 | ORAL_TABLET | ORAL | Status: DC | PRN

## 2012-03-21 MED ORDER — BUPIVACAINE-EPINEPHRINE PF 0.5-1:200000 % IJ SOLN
INTRAMUSCULAR | Status: DC | PRN
  Administered 2012-03-21: 7 mL

## 2012-03-21 MED ORDER — FENTANYL CITRATE 0.05 MG/ML IJ SOLN
INTRAMUSCULAR | Status: DC | PRN
  Administered 2012-03-21: 150 ug via INTRAVENOUS
  Administered 2012-03-21: 100 ug via INTRAVENOUS

## 2012-03-21 MED ORDER — OXYCODONE HCL 5 MG PO TABS: 5.0000 mg | ORAL_TABLET | Freq: Once | ORAL | Status: DC | PRN

## 2012-03-21 MED ORDER — DIPHENHYDRAMINE HCL 12.5 MG/5ML PO ELIX: 12.5000 mg | ORAL_SOLUTION | ORAL | Status: DC | PRN

## 2012-03-21 MED ORDER — MIDAZOLAM HCL 2 MG/2ML IJ SOLN: 0.5000 mg | Freq: Once | INTRAMUSCULAR | Status: DC | PRN

## 2012-03-21 MED ORDER — LACTATED RINGERS IV SOLN
INTRAVENOUS | Status: DC | PRN
  Administered 2012-03-21 (×2): via INTRAVENOUS

## 2012-03-21 MED ORDER — METHOCARBAMOL 100 MG/ML IJ SOLN
500.0000 mg | Freq: Four times a day (QID) | INTRAMUSCULAR | Status: DC | PRN
  Administered 2012-03-21: 500 mg via INTRAVENOUS
  Filled 2012-03-21: qty 5

## 2012-03-21 MED ORDER — METOCLOPRAMIDE HCL 5 MG/ML IJ SOLN: 5.0000 mg | Freq: Three times a day (TID) | INTRAMUSCULAR | Status: DC | PRN

## 2012-03-21 MED ORDER — MIDAZOLAM HCL 5 MG/5ML IJ SOLN
INTRAMUSCULAR | Status: DC | PRN
  Administered 2012-03-21: 2 mg via INTRAVENOUS

## 2012-03-21 MED ORDER — FENTANYL CITRATE 0.05 MG/ML IJ SOLN: 50.0000 ug | INTRAMUSCULAR | Status: DC | PRN

## 2012-03-21 MED ORDER — ACETAMINOPHEN 10 MG/ML IV SOLN
INTRAVENOUS | Status: AC
  Filled 2012-03-21: qty 100

## 2012-03-21 MED ORDER — TRAMADOL HCL 50 MG PO TABS
50.0000 mg | ORAL_TABLET | Freq: Four times a day (QID) | ORAL | Status: DC | PRN
  Administered 2012-03-22 – 2012-03-24 (×6): 50 mg via ORAL
  Filled 2012-03-21 (×6): qty 1

## 2012-03-21 MED ORDER — HYDROMORPHONE HCL PF 1 MG/ML IJ SOLN
INTRAMUSCULAR | Status: AC
  Filled 2012-03-21: qty 1

## 2012-03-21 MED ORDER — CEFAZOLIN SODIUM 1-5 GM-% IV SOLN
1.0000 g | Freq: Four times a day (QID) | INTRAVENOUS | Status: AC
  Administered 2012-03-21 – 2012-03-22 (×3): 1 g via INTRAVENOUS
  Filled 2012-03-21 (×3): qty 50

## 2012-03-21 MED ORDER — MIDAZOLAM HCL 2 MG/2ML IJ SOLN: 1.0000 mg | INTRAMUSCULAR | Status: DC | PRN

## 2012-03-21 MED ORDER — BUPIVACAINE-EPINEPHRINE PF 0.5-1:200000 % IJ SOLN
INTRAMUSCULAR | Status: DC | PRN
  Administered 2012-03-21: 30 mL

## 2012-03-21 SURGICAL SUPPLY — 68 items
BLADE SAW SAG 73X25 THK (BLADE) ×1
BLADE SAW SGTL 73X25 THK (BLADE) ×1 IMPLANT
BUR SURG 4X8 MED (BURR) IMPLANT
BURR SURG 4X8 MED (BURR)
CLOTH BEACON ORANGE TIMEOUT ST (SAFETY) ×2 IMPLANT
CLSR STERI-STRIP ANTIMIC 1/2X4 (GAUZE/BANDAGES/DRESSINGS) ×1 IMPLANT
COVER SURGICAL LIGHT HANDLE (MISCELLANEOUS) ×2 IMPLANT
DRAPE INCISE IOBAN 66X45 STRL (DRAPES) ×2 IMPLANT
DRAPE U-SHAPE 47X51 STRL (DRAPES) ×2 IMPLANT
DRAPE X-RAY CASS 24X20 (DRAPES) IMPLANT
DRILL BIT 5/64 (BIT) ×2 IMPLANT
DRSG ADAPTIC 3X8 NADH LF (GAUZE/BANDAGES/DRESSINGS) ×2 IMPLANT
DRSG PAD ABDOMINAL 8X10 ST (GAUZE/BANDAGES/DRESSINGS) ×4 IMPLANT
DURAPREP 26ML APPLICATOR (WOUND CARE) ×2 IMPLANT
ELECT BLADE 4.0 EZ CLEAN MEGAD (MISCELLANEOUS) ×2
ELECT NDL TIP 2.8 STRL (NEEDLE) ×1 IMPLANT
ELECT NEEDLE TIP 2.8 STRL (NEEDLE) ×2 IMPLANT
ELECT REM PT RETURN 9FT ADLT (ELECTROSURGICAL) ×2
ELECTRODE BLDE 4.0 EZ CLN MEGD (MISCELLANEOUS) ×1 IMPLANT
ELECTRODE REM PT RTRN 9FT ADLT (ELECTROSURGICAL) ×1 IMPLANT
GLOVE BIOGEL PI ORTHO PRO 7.5 (GLOVE) ×1
GLOVE BIOGEL PI ORTHO PRO SZ8 (GLOVE) ×1
GLOVE ORTHO TXT STRL SZ7.5 (GLOVE) ×2 IMPLANT
GLOVE PI ORTHO PRO STRL 7.5 (GLOVE) ×1 IMPLANT
GLOVE PI ORTHO PRO STRL SZ8 (GLOVE) ×1 IMPLANT
GLOVE SURG ORTHO 8.5 STRL (GLOVE) ×4 IMPLANT
GOWN STRL REIN XL XLG (GOWN DISPOSABLE) ×6 IMPLANT
HANDPIECE INTERPULSE COAX TIP (DISPOSABLE)
HEAD HUM ECCENTRIC 44X18 STRL (Trauma) ×1 IMPLANT
KIT BASIN OR (CUSTOM PROCEDURE TRAY) ×2 IMPLANT
KIT ROOM TURNOVER OR (KITS) ×2 IMPLANT
MANIFOLD NEPTUNE II (INSTRUMENTS) ×2 IMPLANT
NDL 1/2 CIR MAYO (NEEDLE) ×1 IMPLANT
NDL HYPO 25GX1X1/2 BEV (NEEDLE) ×1 IMPLANT
NDL SUT 6 .5 CRC .975X.05 MAYO (NEEDLE) ×1 IMPLANT
NEEDLE 1/2 CIR MAYO (NEEDLE) ×2 IMPLANT
NEEDLE HYPO 25GX1X1/2 BEV (NEEDLE) ×2 IMPLANT
NEEDLE MAYO TAPER (NEEDLE) ×2
NS IRRIG 1000ML POUR BTL (IV SOLUTION) ×2 IMPLANT
PACK SHOULDER (CUSTOM PROCEDURE TRAY) ×2 IMPLANT
PAD ARMBOARD 7.5X6 YLW CONV (MISCELLANEOUS) ×4 IMPLANT
SET HNDPC FAN SPRY TIP SCT (DISPOSABLE) IMPLANT
SLING ARM IMMOBILIZER LRG (SOFTGOODS) ×2 IMPLANT
SLING ARM IMMOBILIZER MED (SOFTGOODS) IMPLANT
SMARTMIX MINI TOWER (MISCELLANEOUS) ×2
SPONGE GAUZE 4X4 12PLY (GAUZE/BANDAGES/DRESSINGS) ×2 IMPLANT
SPONGE LAP 18X18 X RAY DECT (DISPOSABLE) ×2 IMPLANT
SPONGE LAP 4X18 X RAY DECT (DISPOSABLE) ×2 IMPLANT
SPONGE SURGIFOAM ABS GEL SZ50 (HEMOSTASIS) IMPLANT
STEM HUMERAL 12MM (Trauma) ×1 IMPLANT
STRIP CLOSURE SKIN 1/2X4 (GAUZE/BANDAGES/DRESSINGS) ×2 IMPLANT
SUCTION FRAZIER TIP 10 FR DISP (SUCTIONS) ×2 IMPLANT
SUT FIBERWIRE #2 38 T-5 BLUE (SUTURE) ×4
SUT MNCRL AB 4-0 PS2 18 (SUTURE) ×2 IMPLANT
SUT VIC AB 0 CT1 27 (SUTURE) ×2
SUT VIC AB 0 CT1 27XBRD ANBCTR (SUTURE) ×1 IMPLANT
SUT VIC AB 2-0 CT1 27 (SUTURE) ×2
SUT VIC AB 2-0 CT1 TAPERPNT 27 (SUTURE) ×1 IMPLANT
SUT VICRYL AB 2 0 TIES (SUTURE) ×2 IMPLANT
SUTURE FIBERWR #2 38 T-5 BLUE (SUTURE) ×2 IMPLANT
SYR CONTROL 10ML LL (SYRINGE) ×2 IMPLANT
TAPE CLOTH SURG 6X10 WHT LF (GAUZE/BANDAGES/DRESSINGS) ×1 IMPLANT
TOWEL OR 17X24 6PK STRL BLUE (TOWEL DISPOSABLE) ×2 IMPLANT
TOWEL OR 17X26 10 PK STRL BLUE (TOWEL DISPOSABLE) ×2 IMPLANT
TOWER SMARTMIX MINI (MISCELLANEOUS) ×1 IMPLANT
TRAY FOLEY CATH 14FR (SET/KITS/TRAYS/PACK) ×2 IMPLANT
WATER STERILE IRR 1000ML POUR (IV SOLUTION) ×2 IMPLANT
YANKAUER SUCT BULB TIP NO VENT (SUCTIONS) IMPLANT

## 2012-03-21 NOTE — Anesthesia Postprocedure Evaluation (Signed)
  Anesthesia Post-op Note  Patient: Patrick Logan  Procedure(s) Performed: Procedure(s) (LRB) with comments: TOTAL SHOULDER ARTHROPLASTY (Left) - Left Shoudler Hemi Arthroplasty  Patient Location: PACU  Anesthesia Type:GA combined with regional for post-op pain  Level of Consciousness: awake, alert , oriented and patient cooperative  Airway and Oxygen Therapy: Patient Spontanous Breathing  Post-op Pain: none  Post-op Assessment: Post-op Vital signs reviewed, Patient's Cardiovascular Status Stable, Respiratory Function Stable, Patent Airway, No signs of Nausea or vomiting and Pain level controlled  Post-op Vital Signs: Reviewed and stable  Complications: No apparent anesthesia complications

## 2012-03-21 NOTE — Preoperative (Signed)
Beta Blockers   Reason not to administer Beta Blockers:Not Applicable 

## 2012-03-21 NOTE — Interval H&P Note (Signed)
History and Physical Interval Note:  03/21/2012 9:32 AM  Patrick Logan  has presented today for surgery, with the diagnosis of Left Shoudler Avascular Necrosis  The various methods of treatment have been discussed with the patient and family. After consideration of risks, benefits and other options for treatment, the patient has consented to  Procedure(s) (LRB) with comments: TOTAL SHOULDER ARTHROPLASTY (Left) - Left Shoudler Arthroplasty vs a Hemi Arthroplasty as a surgical intervention .  The patient's history has been reviewed, patient examined, no change in status, stable for surgery.  I have reviewed the patient's chart and labs.  Questions were answered to the patient's satisfaction.     Sharita Bienaime,STEVEN R

## 2012-03-21 NOTE — Anesthesia Preprocedure Evaluation (Addendum)
Anesthesia Evaluation  Patient identified by MRN, date of birth, ID band  Reviewed: Allergy & Precautions, H&P , NPO status , Patient's Chart, lab work & pertinent test results  Airway Mallampati: II      Dental  (+) Teeth Intact   Pulmonary asthma ,          Cardiovascular hypertension,     Neuro/Psych  Headaches, Anxiety Depression    GI/Hepatic GERD-  Controlled,  Endo/Other    Renal/GU      Musculoskeletal   Abdominal   Peds  Hematology   Anesthesia Other Findings   Reproductive/Obstetrics                          Anesthesia Physical Anesthesia Plan  ASA: II  Anesthesia Plan: General   Post-op Pain Management:    Induction: Intravenous  Airway Management Planned: Oral ETT  Additional Equipment:   Intra-op Plan:   Post-operative Plan: Extubation in OR  Informed Consent: I have reviewed the patients History and Physical, chart, labs and discussed the procedure including the risks, benefits and alternatives for the proposed anesthesia with the patient or authorized representative who has indicated his/her understanding and acceptance.   Dental advisory given  Plan Discussed with: CRNA and Anesthesiologist  Anesthesia Plan Comments:         Anesthesia Quick Evaluation

## 2012-03-21 NOTE — Plan of Care (Signed)
Problem: Consults Goal: Diagnosis- Total Joint Replacement Hemiarthroplasty shoulder

## 2012-03-21 NOTE — Transfer of Care (Signed)
Immediate Anesthesia Transfer of Care Note  Patient: Patrick Logan  Procedure(s) Performed: Procedure(s) (LRB) with comments: TOTAL SHOULDER ARTHROPLASTY (Left) - Left Shoudler Hemi Arthroplasty  Patient Location: PACU  Anesthesia Type:General and Regional  Level of Consciousness: sedated  Airway & Oxygen Therapy: Patient Spontanous Breathing and Patient connected to face mask oxygen  Post-op Assessment: Report given to PACU RN and Patient moving all extremities  Post vital signs: Reviewed and stable  Complications: No apparent anesthesia complications

## 2012-03-21 NOTE — Anesthesia Procedure Notes (Addendum)
Anesthesia Regional Block:  Interscalene brachial plexus block  Pre-Anesthetic Checklist: ,, timeout performed, Correct Patient, Correct Site, Correct Laterality, Correct Procedure, Correct Position, site marked, Risks and benefits discussed,  Surgical consent,  Pre-op evaluation,  At surgeon's request and post-op pain management  Laterality: Left  Prep: chloraprep       Needles:  Injection technique: Single-shot  Needle Type: Stimulator Needle - 40     Needle Length: 4cm  Needle Gauge: 22 and 22 G    Additional Needles:  Procedures: nerve stimulator Interscalene brachial plexus block  Nerve Stimulator or Paresthesia:  Response: forearm twitch, 0.45 mA, 0.1 ms,   Additional Responses:   Narrative:  Start time: 03/21/2012 9:30 AM End time: 03/21/2012 9:36 AM Injection made incrementally with aspirations every 5 mL.  Performed by: Personally  Anesthesiologist: Sandford Craze, MD  Additional Notes: Pt identified in Holding room.  Monitors applied. Working IV access confirmed. Sterile prep L neck.  #22ga PNS to forearm twitch at 0.56mA threshold.  30cc 0.5% Bupivacaine with 1:200k epi injected incrementally after negative test dose.  Patient asymptomatic, VSS, no heme aspirated, tolerated well.   Sandford Craze, MD  Interscalene brachial plexus block Procedure Name: Intubation Date/Time: 03/21/2012 9:51 AM Performed by: Sharlene Dory E Pre-anesthesia Checklist: Patient identified, Emergency Drugs available, Suction available, Patient being monitored and Timeout performed Patient Re-evaluated:Patient Re-evaluated prior to inductionOxygen Delivery Method: Circle system utilized Preoxygenation: Pre-oxygenation with 100% oxygen Intubation Type: IV induction Ventilation: Mask ventilation without difficulty Laryngoscope Size: Mac and 4 Grade View: Grade I Tube type: Oral Tube size: 7.5 mm Number of attempts: 1 Airway Equipment and Method: Stylet Placement Confirmation: ETT inserted  through vocal cords under direct vision,  positive ETCO2 and breath sounds checked- equal and bilateral Secured at: 23 cm Tube secured with: Tape Dental Injury: Teeth and Oropharynx as per pre-operative assessment  Comments: Intubation per Jackelyn Hoehn, Paramedic student, with supervision by Dr. Sandford Craze. Grade I view, +ETCO2 & BBS=.

## 2012-03-21 NOTE — Brief Op Note (Signed)
03/21/2012  11:43 AM  PATIENT:  Patrick Logan  42 y.o. male  PRE-OPERATIVE DIAGNOSIS:  Left Shoudler Avascular Necrosis  POST-OPERATIVE DIAGNOSIS:  Left Shoudler Avascular Necrosis  PROCEDURE:  Procedure(s) (LRB) with comments: TOTAL SHOULDER ARTHROPLASTY (Left) - Left Shoudler Hemi Arthroplasty  SURGEON:  Surgeon(s) and Role:    * Verlee Rossetti, MD - Primary  PHYSICIAN ASSISTANT:   ASSISTANTS: Standley Dakins PA-C   ANESTHESIA:   regional  EBL:  Total I/O In: 1000 [I.V.:1000] Out: 50 [Blood:50]  BLOOD ADMINISTERED:none  DRAINS: none   LOCAL MEDICATIONS USED:  MARCAINE     SPECIMEN:  No Specimen  DISPOSITION OF SPECIMEN:  N/A  COUNTS:  YES  TOURNIQUET:  * No tourniquets in log *  DICTATION: .Note written in EPIC  PLAN OF CARE: Admit for overnight observation  PATIENT DISPOSITION:  PACU - hemodynamically stable.   Delay start of Pharmacological VTE agent (>24hrs) due to surgical blood loss or risk of bleeding: yes

## 2012-03-22 LAB — BASIC METABOLIC PANEL
BUN: 14 mg/dL (ref 6–23)
Creatinine, Ser: 0.96 mg/dL (ref 0.50–1.35)
GFR calc Af Amer: >90 mL/min (ref >90)
GFR calc non Af Amer: >90 mL/min (ref >90)
Glucose, Bld: 111 mg/dL — ABNORMAL HIGH (ref 70–99)

## 2012-03-22 MED ORDER — OXYCODONE-ACETAMINOPHEN 5-325 MG PO TABS
1.0000 | ORAL_TABLET | ORAL | Status: DC | PRN
  Administered 2012-03-22 – 2012-03-23 (×5): 2 via ORAL
  Filled 2012-03-22 (×5): qty 2

## 2012-03-22 NOTE — Discharge Summary (Signed)
Physician Discharge Summary   Patient ID: Patrick Logan MRN: [id number] DOB/AGE: 11/24/1970 42 y.o.  Admit date: 03/21/2012 Discharge date: 03/22/2012  Admission Diagnoses:  Principal Problem:  *Osteoarthrosis, unspecified whether generalized or localized, shoulder region   Discharge Diagnoses:  Same   Surgeries: Procedure(s): TOTAL SHOULDER ARTHROPLASTY on 03/21/2012   Consultants: OT  Discharged Condition: Stable  Hospital Course: Patrick Logan is an 42 y.o. male who was admitted 03/21/2012 with a chief complaint of shoulder pain, loss of function and stiffness, and found to have a diagnosis of Osteoarthrosis, unspecified whether generalized or localized, shoulder region.  They were brought to the operating room on 03/21/2012 and underwent the above named procedures.    The patient had an uncomplicated hospital course and was stable for discharge.  Recent vital signs:  Filed Vitals:   03/22/12 0640  BP: 130/78  Pulse: 92  Temp: 98.9 F (37.2 C)  Resp: 20    Recent laboratory studies:  Results for orders placed during the hospital encounter of 03/18/12  SURGICAL PCR SCREEN      Component Value Range   MRSA, PCR NEGATIVE  NEGATIVE   Staphylococcus aureus POSITIVE (*) NEGATIVE  BASIC METABOLIC PANEL      Component Value Range   Sodium 140  135 - 145 mEq/L   Potassium 4.1  3.5 - 5.1 mEq/L   Chloride 102  96 - 112 mEq/L   CO2 26  19 - 32 mEq/L   Glucose, Bld 108 (*) 70 - 99 mg/dL   BUN 21  6 - 23 mg/dL   Creatinine, Ser 0.86  0.50 - 1.35 mg/dL   Calcium 9.5  8.4 - 57.8 mg/dL   GFR calc non Af Amer >90  >90 mL/min   GFR calc Af Amer >90  >90 mL/min  CBC      Component Value Range   WBC 14.3 (*) 4.0 - 10.5 K/uL   RBC 5.15  4.22 - 5.81 MIL/uL   Hemoglobin 15.6  13.0 - 17.0 g/dL   HCT 46.9  62.9 - 52.8 %   MCV 87.6  78.0 - 100.0 fL   MCH 30.3  26.0 - 34.0 pg   MCHC 34.6  30.0 - 36.0 g/dL   RDW 41.3  24.4 - 01.0 %   Platelets 347  150 - 400 K/uL  TYPE AND  SCREEN      Component Value Range   ABO/RH(D) O POS     Antibody Screen NEG     Sample Expiration 04/01/2012    ABO/RH      Component Value Range   ABO/RH(D) O POS      Discharge Medications:     Medication List     As of 03/22/2012  8:09 AM    TAKE these medications         albuterol 108 (90 BASE) MCG/ACT inhaler   Commonly known as: PROVENTIL HFA;VENTOLIN HFA   Inhale 2 puffs into the lungs every 6 (six) hours as needed for wheezing.      BRONCHIAL ASTHMA RELIEF PO   Take 2 tablets by mouth daily as needed. For asthma      methocarbamol 500 MG tablet   Commonly known as: ROBAXIN   Take 1 tablet (500 mg total) by mouth 3 (three) times daily as needed.      oxyCODONE-acetaminophen 5-325 MG per tablet   Commonly known as: PERCOCET/ROXICET   Take 1-2 tablets by mouth every 4 (four) hours as needed for pain.  oxyCODONE-acetaminophen 5-325 MG per tablet   Commonly known as: PERCOCET/ROXICET   Take 1 tablet by mouth every 4 (four) hours as needed. For pain      traMADol 50 MG tablet   Commonly known as: ULTRAM   Take 50 mg by mouth every 6 (six) hours as needed. For pain        Diagnostic Studies: Dg Chest 2 View  03/18/2012  *RADIOLOGY REPORT*  Clinical Data: Asthma; preoperative shoulder arthroplasty  CHEST - 2 VIEW  Comparison:  April 12, 2010  Findings:  Lungs clear.  Heart size and pulmonary vascularity are normal.  No adenopathy.  No bone lesions.  IMPRESSION: No abnormality noted.   Original Report Authenticated By: Bretta Bang, M.D.    Dg Shoulder Left Port  03/21/2012  *RADIOLOGY REPORT*  Clinical Data: Left shoulder arthroplasty.  PORTABLE LEFT SHOULDER - 2+ VIEW  Comparison: 12/18/2007.  Findings: The left humeral prosthesis appears well seated without complicating features.  There are surgical changes from a prior distal clavicle resection.  IMPRESSION: Well seated left humeral prosthesis without complicating features.   Original Report Authenticated  By: Rudie Meyer, M.D.     Disposition: 01-Home or Self Care        Follow-up Information    Follow up with Kyria Bumgardner,STEVEN R, MD. Call in 2 weeks. [phone])    Contact information:   [address], [address], [address] [place] [address] [phone]           Signed: Celinda Dethlefs,STEVEN R 03/22/2012, 8:09 AM

## 2012-03-22 NOTE — Progress Notes (Signed)
Orthopedics Progress Note  Subjective: I had a rough nite.  I am hurting this morning.  Objective:  Filed Vitals:   03/22/12 0640  BP: 130/78  Pulse: 92  Temp: 98.9 F (37.2 C)  Resp: 20    General: Awake and alert  Musculoskeletal: Left shoulder dressing CDI, NVI left arm able to move hand and fingers without pain Neurovascularly intact  Lab Results  Component Value Date   WBC 14.3* 03/18/2012   HGB 15.6 03/18/2012   HCT 45.1 03/18/2012   MCV 87.6 03/18/2012   PLT 347 03/18/2012       Component Value Date/Time   NA 140 03/18/2012 1243   K 4.1 03/18/2012 1243   CL 102 03/18/2012 1243   CO2 26 03/18/2012 1243   GLUCOSE 108* 03/18/2012 1243   BUN 21 03/18/2012 1243   CREATININE 0.97 03/18/2012 1243   CREATININE 0.94 02/14/2012 1019   CALCIUM 9.5 03/18/2012 1243   GFRNONAA >90 03/18/2012 1243   GFRAA >90 03/18/2012 1243    No results found for this basename: INR, PROTIME    Assessment/Plan: POD #1 s/p Procedure(s): TOTAL SHOULDER ARTHROPLASTY Pain still not under control enough to go home today. OT for AAROM, ADLs, limit WB pendulums, lap slides, hand to face ok Likely d/c tomorrow Rx on chart, D/C summary pended   Almedia Balls. Ranell Patrick, MD 03/22/2012 8:06 AM

## 2012-03-22 NOTE — Evaluation (Signed)
Occupational Therapy Evaluation Patient Details Name: Patrick Logan MRN: [id number] DOB: 09-13-1970 Today's Date: 03/22/2012 Time: 4098-1191 OT Time Calculation (min): 36 min  OT Assessment / Plan / Recommendation Clinical Impression  42 yo male s/p Lt TSA by Dr. Ranell Patrick with (protocol). Ot to follow acutely. Recommend follow up with MD for further therapy recommendations in 2 weeks  OT for AAROM, ADLs, limit WB pendulums, lap slides, hand to face ok     OT Assessment  Patient needs continued OT Services    Follow Up Recommendations  No OT follow up    Barriers to Discharge      Equipment Recommendations  None recommended by OT    Recommendations for Other Services    Frequency  Min 4X/week    Precautions / Restrictions Precautions Precautions: Shoulder Type of Shoulder Precautions: AAROM 30, 60, 90, pendulum exercises, hand to mouth okay, NO AROM Restrictions Weight Bearing Restrictions: Yes LUE Weight Bearing: Non weight bearing   Pertinent Vitals/Pain 10 out 10 pain Facial grimace Limited participation due to pain and needing rest breaks due to pain    ADL  Eating/Feeding: Set up Where Assessed - Eating/Feeding: Chair Grooming: Wash/dry face;Set up Where Assessed - Grooming: Supported sitting Upper Body Bathing: Chest;Left arm;Abdomen;Right arm;Minimal assistance Where Assessed - Upper Body Bathing: Supported sitting Upper Body Dressing: Set up Where Assessed - Upper Body Dressing: Unsupported sitting Lower Body Dressing: Moderate assistance (don shorts and boxers, socks) Where Assessed - Lower Body Dressing: Unsupported sit to stand Toilet Transfer: Modified independent Toilet Transfer Method: Sit to stand Toilet Transfer Equipment: Regular height toilet Toileting - Clothing Manipulation and Hygiene: Min guard Where Assessed - Toileting Clothing Manipulation and Hygiene: Sit to stand from 3-in-1 or toilet Equipment Used:  (shoulder sling) Transfers/Ambulation  Related to ADLs: Pt completed sit<>stand basic transfer Mod I ADL Comments: pt educated on ADLs with shoulder precautions and sling positioning. pt able to don doff sling. Pt provided handout. Pt demonstrates don/doff sling without deficits. Pt limited by pain this session but participating. RN called to room to assist with pain medication when available and provided ice. Pt with bulky dressing and RN asked if dressing change could occur to allow ice to incision site to provide cold compression more affectively. Pt required (A) to don sock underwear and shorts. ALl education complete and pt will be seen Sunday if remains over night     OT Diagnosis: Generalized weakness;Acute pain  OT Problem List: Decreased range of motion;Decreased activity tolerance;Decreased knowledge of precautions;Pain OT Treatment Interventions: Self-care/ADL training;Therapeutic exercise;Therapeutic activities;Balance training;Patient/family education   OT Goals Acute Rehab OT Goals OT Goal Formulation: With patient Time For Goal Achievement: 04/05/12 Potential to Achieve Goals: Good ADL Goals Pt Will Perform Upper Body Bathing: with set-up;Sitting, chair;Supported ADL Goal: Upper Body Bathing - Progress: Goal set today Pt Will Perform Upper Body Dressing: with set-up;Sitting, chair;Supported ADL Goal: Location manager Dressing - Progress: Goal set today Arm Goals Additional Arm Goal #1: Pt will complete pendulum exercises Supervision (using handout PRN)   Arm Goal: Additional Goal #1 - Progress: Goal set today Additional Arm Goal #2: Pt will tolerate gliding arm on legs, 30 er,60 abduction,90 FF as precursor to adls  Arm Goal: Additional Goal #2 - Progress: Goal set today  Visit Information  Last OT Received On: 03/22/12 Assistance Needed: +1    Subjective Data  Subjective: "Last night was rough" Patient Stated Goal: to return home with son and mother assist   Prior Functioning  Home Living Lives With: Son  (mother) Available Help at Discharge: Family Type of Home: House Prior Function Level of Independence: Independent Able to Take Stairs?: Yes Driving: Yes Communication Communication: No difficulties Dominant Hand: Right         Vision/Perception     Cognition  Overall Cognitive Status: Appears within functional limits for tasks assessed/performed Arousal/Alertness: Awake/alert Orientation Level: Appears intact for tasks assessed Behavior During Session: [facility] for tasks performed    Extremity/Trunk Assessment Right Upper Extremity Assessment RUE ROM/Strength/Tone: Within functional levels RUE Sensation: WFL - Light Touch RUE Coordination: WFL - gross/fine motor Left Upper Extremity Assessment LUE ROM/Strength/Tone: Deficits;Due to precautions LUE ROM/Strength/Tone Deficits: AAROM 30 60 90, pendulums, arm slides     Mobility Bed Mobility Bed Mobility: Supine to Sit;Sitting - Scoot to Edge of Bed Supine to Sit: 4: Min assist;HOB elevated Sitting - Scoot to [place] of Bed: 6: Modified independent (Device/Increase time) Details for Bed Mobility Assistance: pt required (A) due to lines and educated on sleeping in recliner at d/c may become more comfortable.  Transfers Transfers: Sit to Stand;Stand to Sit Sit to Stand: 5: Supervision;With upper extremity assist;From bed Stand to Sit: 5: Supervision;With upper extremity assist;To chair/3-in-1 Details for Transfer Assistance: first attempt at eob and progressing well. pt does not require (A)      Shoulder Instructions Donning/doffing shirt without moving shoulder: Minimal assistance Method for sponge bathing under operated UE: Minimal assistance Donning/doffing sling/immobilizer: Modified independent Correct positioning of sling/immobilizer: Modified independent Pendulum exercises (written home exercise program): Minimal assistance ROM for elbow, wrist and digits of operated UE: Modified independent Sling wearing schedule (on at  all times/off for ADL's): Modified independent Positioning of UE while sleeping: Minimal assistanceOT for AAROM, ADLs, limit WB pendulums, lap slides, hand to face ok (from Dr Ranell Patrick progress note 03/22/12)     Shoulder Exercises Pendulum Exercise: PROM;Left;10 reps;Standing        End of Session OT - End of Session Activity Tolerance: Patient tolerated treatment well Patient left: in chair;with call bell/phone within reach Nurse Communication: Mobility status;Precautions  GO Functional Assessment Tool Used: clinical judgement Functional Limitation: Self care Self Care Current Status (W0981): At least 1 percent but less than 20 percent impaired, limited or restricted Self Care Goal Status (X9147): At least 1 percent but less than 20 percent impaired, limited or restricted   Lucile Shutters 03/22/2012, 10:17 AM Pager: [phone]

## 2012-03-22 NOTE — Care Management Utilization Note (Signed)
UR completed 

## 2012-03-22 NOTE — Op Note (Signed)
NAMEGIONNI, VACA                ACCOUNT NO.:  [id number]  MEDICAL RECORD NO.:  [id number]  LOCATION:  5N07C                        FACILITY:  [facility]  PHYSICIAN:  Almedia Balls. Ranell Patrick, M.D. 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  PROCEDURE PERFORMED:  Left shoulder hemiarthroplasty using DePuy global advantage.  ATTENDING SURGEON:  Almedia Balls. Ranell Patrick, M.D.  ASSISTANT:  Donnie Coffin. Dixon, PA-C, who was scrubbed the entire procedure, necessary for satisfactory completion for surgery.  ANESTHESIA:  General anesthesia used plus interscalene block.  ESTIMATED BLOOD LOSS:  Less than 100 mL.  FLUID REPLACED:  1500 mL of crystalloid.  INSTRUMENT COUNTS:  Correct.  COMPLICATIONS:  There were no complications.  Perioperative antibiotics were given.  INDICATIONS:  The patient is a 42 year old male who suffered avascular necrosis, left shoulder.  The patient has had progressive pain in his shoulder and progression of arthritis despite conservative management. He has had injections, modification of activities, on narcotic pain medications control his pain.  He has been managed conservatively as long as we possibly could.  The patient now presents for operative treatment to restore function, eliminate pain to his shoulder.  Informed consent obtained.  DESCRIPTION OF PROCEDURE:  After an adequate level of anesthesia achieved, the patient was positioned in modified beach-chair position. Left shoulder correctly identified.  Time-out called.  Sterile prep and drape of the shoulder and arm performed.  We entered the shoulder using standard deltopectoral approach, starting at the coracoid process extending down to  the anterior humerus.  Dissection down through subcutaneous tissues using Bovie electrocautery.  The cephalic vein identified, taken laterally the deltoid, pectoralis taken medially.  We identified the conjoint tendon, retracted that medially.  We placed our deep retractors after dissecting the clavicle pectoral fascia.  We divided the subscapularis sharply off the lesser tuberosity.  The patient's biceps tendon had been previously tenodesed.  We placed tag sutures, #2 FiberWire sutures in a modified Mason-Allen suture technique through the end of the subscapularis tendon for repair at the end.  We released the soft tissue off the neck of the humerus, progressively externally rotating.  We then went ahead and resected the humeral head, using the neck resection guide for the global advantage system.  We set the resection about 20 degrees of retroversion.  We used an oscillating saw.  We removed the remaining bone spurs off the humerus.  We next verified the integrity of the rotator cuff, it was intact.  There was fair amount of wear in the glenoid but there was still cartilage and a nice concentric glenoid shape with some labrum remaining.  We deemed the glenoid satisfactory to proceed with a hemiarthroplasty tendinosis was the best option for 42 year old individual, not proceeding with a glenoid.  We did go and remove the anterior-inferior capsule as this appeared to be thickened and inflamed, and did not want this to restrict his motion in anyway.  Once that was done, we thoroughly irrigated.  We removed our trial components.  We went ahead and compared the humeral shaft with reaming up to size 12 and then broached for a size 12 global advantage stem and then placed a 44 x 18 eccentric head, which gave good coverage.  We reduced the shoulder, we were happy with soft tissue balancing and range of motion.  Removed the trial components, placed drill holes in the humerus and lesser  tuberosity.  I then placed #2 FiberWire suture through the drill holes for repair of the subscapularis.  We then went ahead using impaction grafting technique press fit via size 12 global advantage body in place and then selected the real 44x18 eccentric head and impacted that into position on the [facility] taper.  We reduced the shoulder.  We then repaired the subscapularis and the rotator interval anatomically with #2 FiberWire suture, getting a nice low-profile repair, ranged the shoulder fully. The repair was not under any undue tension.  We then irrigated and closed the deltopectoral interval with 0 Vicryl suture followed by 2-0 Vicryl for subcutaneous closure, and 4-0 Monocryl for skin.  Steri- Strips applied followed by sterile dressing.  The patient tolerated the surgery well.     Almedia Balls. Ranell Patrick, M.D.     SRN/MEDQ  D:  03/21/2012  T:  03/22/2012  Job:  [id number]

## 2012-03-22 NOTE — Progress Notes (Signed)
MD order received for PT eval.  No PT needs indicated.  OT to address all therapy needs.  PT sign off.  Aida Raider, PT  Office # [phone] Pager [phone]

## 2012-03-23 LAB — BASIC METABOLIC PANEL
BUN: 17 mg/dL (ref 6–23)
CO2: 22 mEq/L (ref 19–32)
Calcium: 9.1 mg/dL (ref 8.4–10.5)
Creatinine, Ser: 1.03 mg/dL (ref 0.50–1.35)
GFR calc Af Amer: >90 mL/min (ref >90)

## 2012-03-23 MED ORDER — OXYCODONE HCL 5 MG PO TABS
5.0000 mg | ORAL_TABLET | ORAL | Status: DC | PRN
  Administered 2012-03-23: 10 mg via ORAL
  Administered 2012-03-23: 15 mg via ORAL
  Administered 2012-03-23: 5 mg via ORAL
  Administered 2012-03-23 – 2012-03-24 (×5): 15 mg via ORAL
  Filled 2012-03-23 (×3): qty 3
  Filled 2012-03-23: qty 2
  Filled 2012-03-23 (×2): qty 3
  Filled 2012-03-23: qty 1
  Filled 2012-03-23 (×2): qty 3

## 2012-03-23 MED ORDER — ACETAMINOPHEN 500 MG PO TABS
1000.0000 mg | ORAL_TABLET | Freq: Three times a day (TID) | ORAL | Status: DC
  Administered 2012-03-23: 1000 mg via ORAL
  Administered 2012-03-23: 500 mg via ORAL
  Administered 2012-03-24: 1000 mg via ORAL
  Filled 2012-03-23 (×6): qty 2

## 2012-03-23 MED ORDER — ACETAMINOPHEN 500 MG PO TABS: 1000.0000 mg | ORAL_TABLET | Freq: Three times a day (TID) | ORAL | Status: DC

## 2012-03-23 MED ORDER — OXYCODONE HCL 5 MG PO TABS: 5.0000 mg | ORAL_TABLET | ORAL | Status: DC | PRN

## 2012-03-23 NOTE — Progress Notes (Signed)
Occupational Therapy Treatment Patient Details Name: Patrick Logan MRN: [id number] DOB: 31-Jan-1971 Today's Date: 03/23/2012 Time: 4098-1191 OT Time Calculation (min): 28 min  OT Assessment / Plan / Recommendation Comments on Treatment Session Pt making good progress and demonstrating excellent recall of HEP.  Continues to be limited by pain (7-10 throughout session).    Follow Up Recommendations  No OT follow up    Barriers to Discharge       Equipment Recommendations  None recommended by OT    Recommendations for Other Services    Frequency Min 4X/week   Plan Discharge plan remains appropriate    Precautions / Restrictions Precautions Precautions: Shoulder Type of Shoulder Precautions: AAROM 30, 60, 90, pendulum exercises, hand to mouth okay, NO AROM Restrictions Weight Bearing Restrictions: Yes LUE Weight Bearing: Non weight bearing   Pertinent Vitals/Pain Pain 7-10 throughout session.  Pain increased with activity.  RN administered pain meds at start of session before beginning exercises.    ADL  Eating/Feeding: Performed;Set up Where Assessed - Eating/Feeding: Edge of bed Toilet Transfer: Simulated;Modified independent Toilet Transfer Method: Sit to Barista:  (bed) Equipment Used:  (shoulder sling) Transfers/Ambulation Related to ADLs: Basic transfers at mod I level. ADL Comments: Focus of session on AAROM and pendulums.  Pt able to perform AAROM of LUE FF 60-70, AB ~40, ER 30 with min VC for technique.  Pt tends to elevate shoulder when performing LUE AAROM abduction.  Pt with excellent recall of "30,60,90 rule" and pendulums.  Pt requiring rest breaks between exercises due to pain.  Applied ice pack to shoulder post exercise to assist in pain management.    OT Diagnosis:    OT Problem List:   OT Treatment Interventions:     OT Goals Acute Rehab OT Goals OT Goal Formulation: With patient Arm Goals Additional Arm Goal #1: Pt will complete  pendulum exercises Supervision (using handout PRN)   Arm Goal: Additional Goal #1 - Progress: Met Additional Arm Goal #2: Pt will tolerate gliding arm on legs, 30 er,60 abduction,90 FF as precursor to adls  Arm Goal: Additional Goal #2 - Progress: Progressing toward goals  Visit Information  Last OT Received On: 03/23/12 Assistance Needed: +1    Subjective Data      Prior Functioning       Cognition  Overall Cognitive Status: Appears within functional limits for tasks assessed/performed Arousal/Alertness: Awake/alert Orientation Level: Appears intact for tasks assessed Behavior During Session: [facility] for tasks performed    Mobility  Shoulder Instructions Bed Mobility Bed Mobility: Supine to Sit;Sitting - Scoot to Edge of Bed;Sit to Supine Supine to Sit: 6: Modified independent (Device/Increase time);With rails;HOB elevated Sitting - Scoot to Edge of Bed: 6: Modified independent (Device/Increase time) Sit to Supine: 6: Modified independent (Device/Increase time);With rail;HOB elevated Details for Bed Mobility Assistance: HOB elevated. Pt reporting he will use recliner at home.  Cannot tolerate laying with HOB flat due to pain. Transfers Transfers: Sit to Stand;Stand to Sit Sit to Stand: 6: Modified independent (Device/Increase time);From bed Stand to Sit: 6: Modified independent (Device/Increase time);To bed   Donning/doffing sling/immobilizer: Modified independent Correct positioning of sling/immobilizer: Modified independent Pendulum exercises (written home exercise program): Modified independent ROM for elbow, wrist and digits of operated UE: Modified independent Sling wearing schedule (on at all times/off for ADL's): Modified independent Proper positioning of operated UE when showering: Minimal assistance Positioning of UE while sleeping: Minimal assistance   Exercises  Shoulder Exercises Pendulum Exercise: PROM;Left;10 reps;Standing  Shoulder Flexion: AAROM;Left;5  reps;Seated Shoulder ABduction: AAROM;Left;5 reps;Seated   Balance     End of Session OT - End of Session Activity Tolerance: Patient limited by pain Patient left: with call bell/phone within reach Nurse Communication: Mobility status;Patient requests pain meds  GO Functional Assessment Tool Used: clinical judgement Functional Limitation: Self care Self Care Current Status (Z6109): At least 1 percent but less than 20 percent impaired, limited or restricted Self Care Goal Status (U0454): At least 1 percent but less than 20 percent impaired, limited or restricted  03/23/2012 Cipriano Mile OTR/L Pager [phone] Office [phone]  Cipriano Mile 03/23/2012, 10:42 AM

## 2012-03-23 NOTE — Care Management Utilization Note (Signed)
UR completed 

## 2012-03-23 NOTE — Progress Notes (Signed)
Patient ID: Patrick Logan, male   DOB: 08-25-1970, 42 y.o.   MRN: [id number] Subjective: 2 Days Post-Op Procedure(s) (LRB): TOTAL SHOULDER ARTHROPLASTY (Left)    Patient reports pain as moderate. Pain is a bit better controlled on more consistent regiment  Objective:   VITALS:   Filed Vitals:   03/23/12 0650  BP: 161/94  Pulse: 105  Temp: 100.2 F (37.9 C)  Resp: 18    Neurovascular intact Incision: dressing C/D/I  LABS No results found for this basename: HGB:3,HCT:3,WBC:3,PLT:3 in the last 72 hours   Basename 03/23/12 0640 03/22/12 0605  NA 136 137  K 3.8 3.9  BUN 17 14  CREATININE 1.03 0.96  GLUCOSE 119* 111*    No results found for this basename: LABPT:2,INR:2 in the last 72 hours   Assessment/Plan: 2 Days Post-Op Procedure(s) (LRB): TOTAL SHOULDER ARTHROPLASTY (Left)   Up with therapy Plan for discharge today Changed pain meds to try and facilitate toerance

## 2012-03-24 ENCOUNTER — Encounter (HOSPITAL_COMMUNITY): Payer: Self-pay | Admitting: General Practice

## 2012-03-24 LAB — BASIC METABOLIC PANEL
CO2: 24 mEq/L (ref 19–32)
Calcium: 9.1 mg/dL (ref 8.4–10.5)
Creatinine, Ser: 0.82 mg/dL (ref 0.50–1.35)
GFR calc non Af Amer: >90 mL/min (ref >90)
Glucose, Bld: 115 mg/dL — ABNORMAL HIGH (ref 70–99)
Sodium: 135 mEq/L (ref 135–145)

## 2012-03-24 NOTE — Progress Notes (Signed)
   Subjective: 3 Days Post-Op Procedure(s) (LRB): TOTAL SHOULDER ARTHROPLASTY (Left)   Patient reports pain as mild. Pain is better after switching to oxy IR Ready for d/c home  Objective:   VITALS:   Filed Vitals:   03/24/12 0533  BP: 152/96  Pulse: 98  Temp: 98.5 F (36.9 C)  Resp: 18    Neurovascular intact Left shoulder incision healing well No drainage or erythema  LABS No results found for this basename: HGB:3,HCT:3,WBC:3,PLT:3 in the last 72 hours   Basename 03/24/12 0650 03/23/12 0640 03/22/12 0605  NA 135 136 137  K 3.9 3.8 3.9  BUN 14 17 14   CREATININE 0.82 1.03 0.96  GLUCOSE 115* 119* 111*     Assessment/Plan: 3 Days Post-Op Procedure(s) (LRB): TOTAL SHOULDER ARTHROPLASTY (Left)   Discharge home with home health F/u in 2 weeks Pt in agreement  [employer], MPAS, PA-C  03/24/2012, 10:16 AM

## 2012-03-24 NOTE — Discharge Summary (Signed)
Physician Discharge Summary   Patient ID: Patrick Logan MRN: [id number] DOB/AGE: 01-Sep-1970 42 y.o.  Admit date: 03/21/2012 Discharge date: 03/24/2012  Admission Diagnoses:  Principal Problem:  *Osteoarthrosis, unspecified whether generalized or localized, shoulder region   Discharge Diagnoses:  Same   Surgeries: Procedure(s): TOTAL SHOULDER ARTHROPLASTY on 03/21/2012   Consultants: PT/OT  Discharged Condition: Stable  Hospital Course: Patrick Logan is an 42 y.o. male who was admitted 03/21/2012 with a chief complaint of No chief complaint on file. , and found to have a diagnosis of Osteoarthrosis, unspecified whether generalized or localized, shoulder region.  They were brought to the operating room on 03/21/2012 and underwent the above named procedures.    The patient had an uncomplicated hospital course and was stable for discharge.  Recent vital signs:  Filed Vitals:   03/24/12 0533  BP: 152/96  Pulse: 98  Temp: 98.5 F (36.9 C)  Resp: 18    Recent laboratory studies:  Results for orders placed during the hospital encounter of 03/21/12  BASIC METABOLIC PANEL      Component Value Range   Sodium 137  135 - 145 mEq/L   Potassium 3.9  3.5 - 5.1 mEq/L   Chloride 100  96 - 112 mEq/L   CO2 22  19 - 32 mEq/L   Glucose, Bld 111 (*) 70 - 99 mg/dL   BUN 14  6 - 23 mg/dL   Creatinine, Ser 4.09  0.50 - 1.35 mg/dL   Calcium 9.0  8.4 - 81.1 mg/dL   GFR calc non Af Amer >90  >90 mL/min   GFR calc Af Amer >90  >90 mL/min  BASIC METABOLIC PANEL      Component Value Range   Sodium 136  135 - 145 mEq/L   Potassium 3.8  3.5 - 5.1 mEq/L   Chloride 100  96 - 112 mEq/L   CO2 22  19 - 32 mEq/L   Glucose, Bld 119 (*) 70 - 99 mg/dL   BUN 17  6 - 23 mg/dL   Creatinine, Ser 9.14  0.50 - 1.35 mg/dL   Calcium 9.1  8.4 - 78.2 mg/dL   GFR calc non Af Amer 89 (*) >90 mL/min   GFR calc Af Amer >90  >90 mL/min  BASIC METABOLIC PANEL      Component Value Range   Sodium 135  135 - 145  mEq/L   Potassium 3.9  3.5 - 5.1 mEq/L   Chloride 99  96 - 112 mEq/L   CO2 24  19 - 32 mEq/L   Glucose, Bld 115 (*) 70 - 99 mg/dL   BUN 14  6 - 23 mg/dL   Creatinine, Ser 9.56  0.50 - 1.35 mg/dL   Calcium 9.1  8.4 - 21.3 mg/dL   GFR calc non Af Amer >90  >90 mL/min   GFR calc Af Amer >90  >90 mL/min    Discharge Medications:     Medication List     As of 03/24/2012 10:17 AM    STOP taking these medications         oxyCODONE-acetaminophen 5-325 MG per tablet   Commonly known as: PERCOCET/ROXICET      traMADol 50 MG tablet   Commonly known as: ULTRAM      TAKE these medications         acetaminophen 500 MG tablet   Commonly known as: TYLENOL   Take 2 tablets (1,000 mg total) by mouth every 8 (eight) hours.  albuterol 108 (90 BASE) MCG/ACT inhaler   Commonly known as: PROVENTIL HFA;VENTOLIN HFA   Inhale 2 puffs into the lungs every 6 (six) hours as needed for wheezing.      BRONCHIAL ASTHMA RELIEF PO   Take 2 tablets by mouth daily as needed. For asthma      methocarbamol 500 MG tablet   Commonly known as: ROBAXIN   Take 1 tablet (500 mg total) by mouth 3 (three) times daily as needed.      oxyCODONE 5 MG immediate release tablet   Commonly known as: Oxy IR/ROXICODONE   Take 1-3 tablets (5-15 mg total) by mouth every 4 (four) hours as needed.        Diagnostic Studies: Dg Chest 2 View  03/18/2012  *RADIOLOGY REPORT*  Clinical Data: Asthma; preoperative shoulder arthroplasty  CHEST - 2 VIEW  Comparison:  April 12, 2010  Findings:  Lungs clear.  Heart size and pulmonary vascularity are normal.  No adenopathy.  No bone lesions.  IMPRESSION: No abnormality noted.   Original Report Authenticated By: Bretta Bang, M.D.    Dg Shoulder Left Port  03/21/2012  *RADIOLOGY REPORT*  Clinical Data: Left shoulder arthroplasty.  PORTABLE LEFT SHOULDER - 2+ VIEW  Comparison: 12/18/2007.  Findings: The left humeral prosthesis appears well seated without complicating  features.  There are surgical changes from a prior distal clavicle resection.  IMPRESSION: Well seated left humeral prosthesis without complicating features.   Original Report Authenticated By: Rudie Meyer, M.D.     Disposition: 01-Home or Self Care        Follow-up Information    Follow up with NORRIS,STEVEN R, MD. Call in 2 weeks. [phone])    Contact information:   [address], [address], [address] [place] [address] [phone]           Signed: Thea Gist 03/24/2012, 10:17 AM

## 2012-04-09 ENCOUNTER — Inpatient Hospital Stay
Admission: RE | Admit: 2012-04-09 | Discharge: 2012-04-09 | Disposition: A | Discharge status: Home / Self Care | Payer: Self-pay | Source: Ambulatory Visit | Attending: Orthopedic Surgery | Admitting: Orthopedic Surgery

## 2012-04-09 ENCOUNTER — Other Ambulatory Visit (HOSPITAL_COMMUNITY): Payer: Self-pay | Admitting: Orthopedic Surgery

## 2012-04-09 DIAGNOSIS — R52 Pain, unspecified: Secondary | ICD-10-CM

## 2012-05-13 ENCOUNTER — Ambulatory Visit: Payer: Medicaid Other | Attending: Orthopedic Surgery | Admitting: Physical Therapy

## 2012-05-13 DIAGNOSIS — M25519 Pain in unspecified shoulder: Secondary | ICD-10-CM | POA: Insufficient documentation

## 2012-05-13 DIAGNOSIS — IMO0001 Reserved for inherently not codable concepts without codable children: Secondary | ICD-10-CM | POA: Insufficient documentation

## 2012-05-13 DIAGNOSIS — Z96619 Presence of unspecified artificial shoulder joint: Secondary | ICD-10-CM | POA: Insufficient documentation

## 2012-05-13 DIAGNOSIS — R209 Unspecified disturbances of skin sensation: Secondary | ICD-10-CM | POA: Insufficient documentation

## 2012-05-20 ENCOUNTER — Ambulatory Visit: Payer: Medicaid Other | Admitting: Physical Therapy

## 2012-05-22 ENCOUNTER — Ambulatory Visit: Payer: Medicaid Other | Admitting: Physical Therapy

## 2012-05-27 ENCOUNTER — Ambulatory Visit: Payer: Medicaid Other | Attending: Orthopedic Surgery | Admitting: Physical Therapy

## 2012-05-27 DIAGNOSIS — R209 Unspecified disturbances of skin sensation: Secondary | ICD-10-CM | POA: Insufficient documentation

## 2012-05-27 DIAGNOSIS — IMO0001 Reserved for inherently not codable concepts without codable children: Secondary | ICD-10-CM | POA: Insufficient documentation

## 2012-05-27 DIAGNOSIS — M25519 Pain in unspecified shoulder: Secondary | ICD-10-CM | POA: Insufficient documentation

## 2012-05-27 DIAGNOSIS — Z96619 Presence of unspecified artificial shoulder joint: Secondary | ICD-10-CM | POA: Insufficient documentation

## 2012-05-29 ENCOUNTER — Ambulatory Visit: Payer: Medicaid Other | Admitting: Physical Therapy

## 2012-06-02 ENCOUNTER — Ambulatory Visit: Payer: Medicaid Other | Admitting: Physical Therapy

## 2012-06-06 ENCOUNTER — Ambulatory Visit: Payer: Medicaid Other | Admitting: Physical Therapy

## 2012-06-09 ENCOUNTER — Ambulatory Visit: Payer: Medicaid Other | Admitting: Physical Therapy

## 2012-06-12 ENCOUNTER — Ambulatory Visit: Payer: Medicaid Other | Admitting: Physical Therapy

## 2012-06-17 ENCOUNTER — Encounter: Payer: Medicaid Other | Admitting: Physical Therapy

## 2012-06-24 ENCOUNTER — Ambulatory Visit: Payer: Medicaid Other | Admitting: Physical Therapy

## 2012-07-01 ENCOUNTER — Ambulatory Visit: Payer: Medicaid Other | Admitting: Physical Therapy

## 2012-07-08 ENCOUNTER — Ambulatory Visit: Payer: Medicaid Other | Admitting: Physical Therapy

## 2012-07-15 ENCOUNTER — Encounter: Payer: Medicaid Other | Admitting: Physical Therapy

## 2012-10-28 ENCOUNTER — Encounter (HOSPITAL_COMMUNITY): Payer: Self-pay | Admitting: Family Medicine

## 2012-10-28 ENCOUNTER — Emergency Department (HOSPITAL_COMMUNITY): Payer: Medicaid Other

## 2012-10-28 ENCOUNTER — Emergency Department (HOSPITAL_COMMUNITY)
Admission: EM | Admit: 2012-10-28 | Discharge: 2012-10-28 | Disposition: A | Discharge status: Home / Self Care | Payer: Medicaid Other | Attending: Emergency Medicine | Admitting: Emergency Medicine

## 2012-10-28 DIAGNOSIS — Z79899 Other long term (current) drug therapy: Secondary | ICD-10-CM | POA: Insufficient documentation

## 2012-10-28 DIAGNOSIS — Y929 Unspecified place or not applicable: Secondary | ICD-10-CM | POA: Insufficient documentation

## 2012-10-28 DIAGNOSIS — W2209XA Striking against other stationary object, initial encounter: Secondary | ICD-10-CM | POA: Insufficient documentation

## 2012-10-28 DIAGNOSIS — S8001XA Contusion of right knee, initial encounter: Secondary | ICD-10-CM

## 2012-10-28 DIAGNOSIS — F329 Major depressive disorder, single episode, unspecified: Secondary | ICD-10-CM | POA: Insufficient documentation

## 2012-10-28 DIAGNOSIS — M129 Arthropathy, unspecified: Secondary | ICD-10-CM | POA: Insufficient documentation

## 2012-10-28 DIAGNOSIS — J45909 Unspecified asthma, uncomplicated: Secondary | ICD-10-CM | POA: Insufficient documentation

## 2012-10-28 DIAGNOSIS — K219 Gastro-esophageal reflux disease without esophagitis: Secondary | ICD-10-CM | POA: Insufficient documentation

## 2012-10-28 DIAGNOSIS — Z872 Personal history of diseases of the skin and subcutaneous tissue: Secondary | ICD-10-CM | POA: Insufficient documentation

## 2012-10-28 DIAGNOSIS — S81009A Unspecified open wound, unspecified knee, initial encounter: Secondary | ICD-10-CM | POA: Insufficient documentation

## 2012-10-28 DIAGNOSIS — T148XXA Other injury of unspecified body region, initial encounter: Secondary | ICD-10-CM

## 2012-10-28 DIAGNOSIS — F411 Generalized anxiety disorder: Secondary | ICD-10-CM | POA: Insufficient documentation

## 2012-10-28 DIAGNOSIS — F3289 Other specified depressive episodes: Secondary | ICD-10-CM | POA: Insufficient documentation

## 2012-10-28 DIAGNOSIS — S8000XA Contusion of unspecified knee, initial encounter: Secondary | ICD-10-CM | POA: Insufficient documentation

## 2012-10-28 DIAGNOSIS — Y9389 Activity, other specified: Secondary | ICD-10-CM | POA: Insufficient documentation

## 2012-10-28 DIAGNOSIS — F17200 Nicotine dependence, unspecified, uncomplicated: Secondary | ICD-10-CM | POA: Insufficient documentation

## 2012-10-28 MED ORDER — CEPHALEXIN 250 MG PO CAPS: 250.0000 mg | ORAL_CAPSULE | Freq: Four times a day (QID) | ORAL | Status: DC

## 2012-10-28 MED ORDER — HYDROCODONE-ACETAMINOPHEN 5-325 MG PO TABS
1.0000 | ORAL_TABLET | Freq: Once | ORAL | Status: DC
  Filled 2012-10-28: qty 1

## 2012-10-28 MED ORDER — CEPHALEXIN 250 MG PO CAPS
250.0000 mg | ORAL_CAPSULE | Freq: Once | ORAL | Status: AC
  Administered 2012-10-28: 250 mg via ORAL
  Filled 2012-10-28: qty 1

## 2012-10-28 MED ORDER — HYDROCODONE-ACETAMINOPHEN 5-325 MG PO TABS: 1.0000 | ORAL_TABLET | Freq: Four times a day (QID) | ORAL | Status: DC | PRN

## 2012-10-28 NOTE — ED Notes (Signed)
Patient states that he was removing tile and hit his right knee with the hammer. Reports pain with weight bearing and swelling. Laceration to right knee; bleeding controlled.

## 2012-10-28 NOTE — ED Notes (Signed)
Patient transported to X-ray 

## 2012-10-28 NOTE — ED Provider Notes (Signed)
CSN: [id number]     Arrival date & time 10/28/12  2003 History  This chart was scribed for non-physician practitioner, Earley Favor, FNP working with Juliet Rude. Rubin Payor, MD by Greggory Stallion, ED scribe. This patient was seen in room WTR7/WTR7 and the patient's care was started at 10:52 PM.   Chief Complaint  Patient presents with  . Knee Injury   The history is provided by the patient. No language interpreter was used.    HPI Comments: Patrick Logan is a 42 y.o. male who presents to the Emergency Department complaining of right knee injury that occurred around 3 PM with associated knee pain and swelling when he was removing tile and hit his knee with the claw part of a hammer. He states bearing weight worsens the pain. Pt states he washed out the wound. He states he takes tramadol on a daily basis but it hasn't relieved the pain at all. His last tetanus was 6 years ago.   Past Medical History  Diagnosis Date  . Allergy   . Arthritis   . GERD (gastroesophageal reflux disease)   . Depression   . Ulcer   . Asthma   . Anxiety    Past Surgical History  Procedure Laterality Date  . Knee surgery      R 2004  and L 2008  . Shoulder surgery  2008    both   . 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 Total shoulder arthroplasty  03/21/2012    Procedure: TOTAL SHOULDER ARTHROPLASTY;  Surgeon: Verlee Rossetti, MD;  Location: [facility] OR;  Service: Orthopedics;  Laterality: Left;  Left Shoudler Hemi Arthroplasty   Family History  Problem Relation Age of Onset  . Hypertension Mother   . Diabetes Father 20  . Autism Son   . Aneurysm Maternal Grandmother   . Alcohol abuse Maternal Grandfather   . Cirrhosis Maternal Grandfather    History  Substance Use Topics  . Smoking status: Current Every Day Smoker -- 0.25 packs/day for 25 years    Types: Cigarettes  . Smokeless tobacco: Never Used  . Alcohol Use: No    Review of Systems  Musculoskeletal: Positive for arthralgias.  Negative for joint swelling.  Skin: Positive for wound.  All other systems reviewed and are negative.    Allergies  Tomato; Aspirin; and Nsaids  Home Medications   Current Outpatient Rx  Name  Route  Sig  Dispense  Refill  . albuterol (PROVENTIL HFA;VENTOLIN HFA) 108 (90 BASE) MCG/ACT inhaler   Inhalation   Inhale 2 puffs into the lungs every 6 (six) hours as needed for wheezing.   1 Inhaler   0   . Ephedrine-Guaifenesin (BRONCHIAL ASTHMA RELIEF PO)   Oral   Take 2 tablets by mouth daily as needed. For asthma         . omeprazole (PRILOSEC) 20 MG capsule   Oral   Take 20 mg by mouth daily.         . traMADol (ULTRAM) 50 MG tablet   Oral   Take 100 mg by mouth every 6 (six) hours as needed for pain.         . cephALEXin (KEFLEX) 250 MG capsule   Oral   Take 1 capsule (250 mg total) by mouth 4 (four) times daily.   20 capsule   0   . HYDROcodone-acetaminophen (NORCO/VICODIN) 5-325 MG per tablet   Oral   Take 1 tablet by mouth every  6 (six) hours as needed for pain.   12 tablet   0    BP 143/97  Pulse 108  Temp(Src) 99.9 F (37.7 C) (Oral)  Resp 24  Ht 5\' 9"  (1.753 m)  Wt 247 lb (112.038 kg)  BMI 36.46 kg/m2  SpO2 96%  Physical Exam  Nursing note and vitals reviewed. Constitutional: He is oriented to person, place, and time. He appears well-developed and well-nourished. No distress.  HENT:  Head: Normocephalic and atraumatic.  Eyes: EOM are normal.  Neck: Neck supple. No tracheal deviation present.  Cardiovascular: Normal rate.   Pulmonary/Chest: Effort normal.  Musculoskeletal: Normal range of motion. He exhibits tenderness. He exhibits no edema.  2 superficial puncture wounds from the cough number to the upper anterior right knee, without surrounding erythema or swelling  Neurological: He is alert and oriented to person, place, and time.  Skin: Skin is warm and dry.  Psychiatric: He has a normal mood and affect. His behavior is normal.    ED  Course  Procedures (including critical care time)  DIAGNOSTIC STUDIES: Oxygen Saturation is 96% on RA, normal by my interpretation.    COORDINATION OF CARE: 11:02 PM-Discussed treatment plan which includes pain medication and antibiotic with pt at bedside and pt agreed to plan. Advised pt to come back to ED if symptoms worsen.   Labs Review Labs Reviewed - No data to display Imaging Review No results found.  MDM   1. Puncture wound   2. Knee contusion, right, initial encounter     Extra reviewed.  There is no, fracture.  We'll treat with antibiotics, and continued pain control.  Patient is to return if he develops, red streaking, or joint swelling  I personally performed the services described in this documentation, which was scribed in my presence. The recorded information has been reviewed and is accurate.      Arman Filter, NP 10/28/12 2314

## 2012-10-29 NOTE — ED Provider Notes (Signed)
Medical screening examination/treatment/procedure(s) were performed by non-physician practitioner and as supervising physician I was immediately available for consultation/collaboration.  Devonta Blanford R. Fernando Stoiber, MD 10/29/12 0038 

## 2013-03-10 ENCOUNTER — Emergency Department (HOSPITAL_COMMUNITY)
Admission: EM | Admit: 2013-03-10 | Discharge: 2013-03-10 | Disposition: A | Discharge status: Home / Self Care | Payer: Medicaid Other | Attending: Emergency Medicine | Admitting: Emergency Medicine

## 2013-03-10 ENCOUNTER — Encounter (HOSPITAL_COMMUNITY): Payer: Self-pay | Admitting: Emergency Medicine

## 2013-03-10 DIAGNOSIS — K219 Gastro-esophageal reflux disease without esophagitis: Secondary | ICD-10-CM | POA: Insufficient documentation

## 2013-03-10 DIAGNOSIS — R509 Fever, unspecified: Secondary | ICD-10-CM

## 2013-03-10 DIAGNOSIS — F3289 Other specified depressive episodes: Secondary | ICD-10-CM | POA: Insufficient documentation

## 2013-03-10 DIAGNOSIS — F329 Major depressive disorder, single episode, unspecified: Secondary | ICD-10-CM | POA: Insufficient documentation

## 2013-03-10 DIAGNOSIS — Z8739 Personal history of other diseases of the musculoskeletal system and connective tissue: Secondary | ICD-10-CM | POA: Insufficient documentation

## 2013-03-10 DIAGNOSIS — R5383 Other fatigue: Secondary | ICD-10-CM

## 2013-03-10 DIAGNOSIS — Z79899 Other long term (current) drug therapy: Secondary | ICD-10-CM | POA: Insufficient documentation

## 2013-03-10 DIAGNOSIS — F17200 Nicotine dependence, unspecified, uncomplicated: Secondary | ICD-10-CM | POA: Insufficient documentation

## 2013-03-10 DIAGNOSIS — J020 Streptococcal pharyngitis: Secondary | ICD-10-CM | POA: Insufficient documentation

## 2013-03-10 DIAGNOSIS — J45901 Unspecified asthma with (acute) exacerbation: Secondary | ICD-10-CM | POA: Insufficient documentation

## 2013-03-10 DIAGNOSIS — Z872 Personal history of diseases of the skin and subcutaneous tissue: Secondary | ICD-10-CM | POA: Insufficient documentation

## 2013-03-10 DIAGNOSIS — R000 Tachycardia, unspecified: Secondary | ICD-10-CM | POA: Insufficient documentation

## 2013-03-10 DIAGNOSIS — F411 Generalized anxiety disorder: Secondary | ICD-10-CM | POA: Insufficient documentation

## 2013-03-10 DIAGNOSIS — R5381 Other malaise: Secondary | ICD-10-CM | POA: Insufficient documentation

## 2013-03-10 LAB — RAPID STREP SCREEN (MED CTR MEBANE ONLY): STREPTOCOCCUS, GROUP A SCREEN (DIRECT): POSITIVE — AB

## 2013-03-10 MED ORDER — ACETAMINOPHEN 500 MG PO TABS
1000.0000 mg | ORAL_TABLET | Freq: Once | ORAL | Status: AC
  Administered 2013-03-10: 1000 mg via ORAL
  Filled 2013-03-10: qty 2

## 2013-03-10 MED ORDER — PENICILLIN G BENZATHINE 1200000 UNIT/2ML IM SUSP
1.2000 10*6.[IU] | Freq: Once | INTRAMUSCULAR | Status: AC
  Administered 2013-03-10: 1.2 10*6.[IU] via INTRAMUSCULAR
  Filled 2013-03-10: qty 2

## 2013-03-10 MED ORDER — KETOROLAC TROMETHAMINE 60 MG/2ML IM SOLN
60.0000 mg | Freq: Once | INTRAMUSCULAR | Status: AC
  Administered 2013-03-10: 60 mg via INTRAMUSCULAR
  Filled 2013-03-10: qty 2

## 2013-03-10 MED ORDER — SODIUM CHLORIDE 0.9 % IV BOLUS (SEPSIS)
1000.0000 mL | Freq: Once | INTRAVENOUS | Status: AC
  Administered 2013-03-10: 1000 mL via INTRAVENOUS

## 2013-03-10 NOTE — ED Provider Notes (Signed)
Medical screening examination/treatment/procedure(s) were performed by non-physician practitioner and as supervising physician I was immediately available for consultation/collaboration.  EKG Interpretation   None         Laray AngerKathleen M Jed Kutch, DO 03/10/13 1539

## 2013-03-10 NOTE — ED Notes (Signed)
Pt accompanied with family. Presents with sore throat x 3 weeks and fever. His strept test was positive.   States he has not been drinking an adequate amount of fluids x 3 weeks.

## 2013-03-10 NOTE — Discharge Instructions (Signed)
Rest, stay well hydrated, take tylenol every 6 hours as needed for pain. You were given an antibiotic shot today of penicillin for your strep throat. Follow up with one of the resources below to establish care with a primary care doctor.  Strep Throat Strep throat is an infection of the throat caused by a bacteria named Streptococcus pyogenes. Your caregiver may call the infection streptococcal "tonsillitis" or "pharyngitis" depending on whether there are signs of inflammation in the tonsils or back of the throat. Strep throat is most common in children aged 5 15 years during the cold months of the year, but it can occur in people of any age during any season. This infection is spread from person to person (contagious) through coughing, sneezing, or other close contact. SYMPTOMS   Fever or chills.  Painful, swollen, red tonsils or throat.  Pain or difficulty when swallowing.  White or yellow spots on the tonsils or throat.  Swollen, tender lymph nodes or "glands" of the neck or under the jaw.  Red rash all over the body (rare). DIAGNOSIS  Many different infections can cause the same symptoms. A test must be done to confirm the diagnosis so the right treatment can be given. A "rapid strep test" can help your caregiver make the diagnosis in a few minutes. If this test is not available, a light swab of the infected area can be used for a throat culture test. If a throat culture test is done, results are usually available in a day or two. TREATMENT  Strep throat is treated with antibiotic medicine. HOME CARE INSTRUCTIONS   Gargle with 1 tsp of salt in 1 cup of warm water, 3 4 times per day or as needed for comfort.  Family members who also have a sore throat or fever should be tested for strep throat and treated with antibiotics if they have the strep infection.  Make sure everyone in your household washes their hands well.  Do not share food, drinking cups, or personal items that could  cause the infection to spread to others.  You may need to eat a soft food diet until your sore throat gets better.  Drink enough water and fluids to keep your urine clear or pale yellow. This will help prevent dehydration.  Get plenty of rest.  Stay home from school, daycare, or work until you have been on antibiotics for 24 hours.  Only take over-the-counter or prescription medicines for pain, discomfort, or fever as directed by your caregiver.  If antibiotics are prescribed, take them as directed. Finish them even if you start to feel better. SEEK MEDICAL CARE IF:   The glands in your neck continue to enlarge.  You develop a rash, cough, or earache.  You cough up green, yellow-brown, or bloody sputum.  You have pain or discomfort not controlled by medicines.  Your problems seem to be getting worse rather than better. SEEK IMMEDIATE MEDICAL CARE IF:   You develop any new symptoms such as vomiting, severe headache, stiff or painful neck, chest pain, shortness of breath, or trouble swallowing.  You develop severe throat pain, drooling, or changes in your voice.  You develop swelling of the neck, or the skin on the neck becomes red and tender.  You have a fever.  You develop signs of dehydration, such as fatigue, dry mouth, and decreased urination.  You become increasingly sleepy, or you cannot wake up completely. Document Released: 02/10/2000 Document Revised: 01/30/2012 Document Reviewed: 04/13/2010 ExitCare Patient Information 2014  [employer]  RESOURCE GUIDE  Chronic Pain Problems: Contact [facility]  [phone] Patients need to be referred by their primary care doctor.  Insufficient Money for Medicine: Contact [employer]:  call "211."   No Primary Care Doctor: - Call [facility] Connect  [phone] - can help you locate a primary care doctor that  accepts your insurance, provides certain services, etc. - Physician Referral Service-  [phone]  Agencies that provide inexpensive medical care: - [facility]  [phone] - [facility]  [phone] - [facility]  [phone] - [facility]  [phone] - [facility]  [phone] - [facility]  [phone]  Medicaid-accepting [facility] Providers: - [facility]- [address]  [phone], Mon-Fri 9am-7pm, Sat 9am-1pm - [facility]- [address] [place], [address] [place]  [phone] - [facility]- [address] [place]  [phone] [facility]- [address]  [phone] - Renaye Rakers- [address] [place], [address], [phone]  Only accepts [place] patients after they have their name  applied to their card  Self Pay (no insurance) in [place]: - Sickle Cell Patients: Dr Willey Blade, [facility]  [address] [place], [phone] - [facility]- [address] [place]  [phone]       [facility]- [address]       -     [facility]- see information above (Speak to [employer] if you do not have insurance)       -  [facility]- [address],  [phone]       -  [facility]- [address] [phone]       -  Dr Julio Sicks-  [address], [place], [phone]       -  [facility] - [address], [phone]       -  [facility]- [address] [phone], also [address], [phone]       -    [facility]- [address] [place], [phone], 1st & 3rd Saturday        every month, 10am-1pm  1) Find a Doctor and Pay Out of Pocket Although you won't have to find out who is covered by your insurance plan, it is a good idea to ask around and get recommendations. You will then need to call the office and see if the doctor you have chosen will  accept you as a new patient and what types of options they offer for patients who are self-pay. Some doctors offer discounts or will set up payment plans for their patients who do not have insurance, but you will need to ask so you aren't surprised when you get to your appointment.  2) Contact Your Local Health Department Not all health departments have doctors that can see patients for sick visits, but many do, so it is worth a call to see if yours does. If you don't know where your local health department is, you can check in your phone book. The CDC also has a tool to help you locate your state's health department, and many state websites also have listings of all of their local health departments.  3)  Find a Walk-in Clinic If your illness is not likely to be very severe or complicated, you may want to try a walk in clinic. These are popping up all over the country in pharmacies, drugstores, and shopping centers. They're usually staffed by nurse practitioners or physician assistants that have been trained to treat common illnesses and complaints. They're usually fairly quick and inexpensive. However, if you have serious medical issues or chronic medical problems, these are probably not your best option

## 2013-03-10 NOTE — ED Notes (Signed)
Spoke with Zella BallRobin PA, it is ok to give Toradol.

## 2013-03-10 NOTE — ED Notes (Signed)
Pt c/o 3 wks worth of fever/congestion/sore throat; states unable to swallow

## 2013-03-10 NOTE — ED Provider Notes (Signed)
CSN: [id number]     Arrival date & time 03/10/13  [id number] History   First MD Initiated Contact with Patient 03/10/13 0601     Chief Complaint  Patient presents with  . Fever   (Consider location/radiation/quality/duration/timing/severity/associated sxs/prior Treatment) HPI Comments: Pt is a 43 y/o male who presents to the ED complaining of sore throat, congestion and fever x 3 weeks. Tmax 103 about 1 1/2 weeks ago, has been taking tylenol intermittently which has broken his fever. States his throat has gotten worse, difficult to swallow and has not tried to eat in a few days. Admits to associated fatigue and body aches, occasional difficulty breathing. Denies chest pain, vomiting.  Patient is a 43 y.o. male presenting with fever. The history is provided by the patient and a parent.  Fever Associated symptoms: congestion and sore throat     Past Medical History  Diagnosis Date  . Allergy   . Arthritis   . GERD (gastroesophageal reflux disease)   . Depression   . Ulcer   . Asthma   . Anxiety    Past Surgical History  Procedure Laterality Date  . Knee surgery      R 2004  and L 2008  . Shoulder surgery  2008    both   . 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 Total shoulder arthroplasty  03/21/2012    Procedure: TOTAL SHOULDER ARTHROPLASTY;  Surgeon: Verlee RossettiSteven R Norris, MD;  [facility];  Service: Orthopedics;  Laterality: Left;  Left Shoudler Hemi Arthroplasty   Family History  Problem Relation Age of Onset  . Hypertension Mother   . Diabetes Father 3159  . Autism Son   . Aneurysm Maternal Grandmother   . Alcohol abuse Maternal Grandfather   . Cirrhosis Maternal Grandfather    History  Substance Use Topics  . Smoking status: Current Every Day Smoker -- 0.25 packs/day for 25 years    Types: Cigarettes  . Smokeless tobacco: Never Used  . Alcohol Use: No    Review of Systems  Constitutional: Positive for fever, appetite change and fatigue.  HENT:  Positive for congestion, sore throat, trouble swallowing and voice change.   Respiratory: Positive for shortness of breath.   Neurological: Positive for weakness.  All other systems reviewed and are negative.    Allergies  Tomato; Aspirin; and Nsaids  Home Medications   Current Outpatient Rx  Name  Route  Sig  Dispense  Refill  . methocarbamol (ROBAXIN) 500 MG tablet   Oral   Take 500 mg by mouth every 6 (six) hours as needed for muscle spasms.         Marland Kitchen. omeprazole (PRILOSEC) 20 MG capsule   Oral   Take 20 mg by mouth daily.         . traMADol (ULTRAM) 50 MG tablet   Oral   Take 100 mg by mouth every 6 (six) hours as needed for pain.         Marland Kitchen. albuterol (PROVENTIL HFA;VENTOLIN HFA) 108 (90 BASE) MCG/ACT inhaler   Inhalation   Inhale 2 puffs into the lungs every 6 (six) hours as needed for wheezing.   1 Inhaler   0    BP 111/66  Pulse 125  Temp(Src) 100.9 F (38.3 C) (Oral)  Resp 20  SpO2 98% Physical Exam  Nursing note and vitals reviewed. Constitutional: He is oriented to person, place, and time. He appears well-developed and well-nourished. No distress.  HENT:  Head: Normocephalic and atraumatic.  Nose: Mucosal edema present.  Mouth/Throat: Uvula is midline. Mucous membranes are dry.  Tonsils enlarged and inflamed bilateral +3 with exudate. No tonsillar abscess.  Eyes: Conjunctivae and EOM are normal.  Neck: Trachea normal and normal range of motion. Neck supple.  Cardiovascular: Regular rhythm, normal heart sounds and intact distal pulses.  Tachycardia present.   Pulmonary/Chest: Effort normal and breath sounds normal. No respiratory distress. He has no wheezes.  Musculoskeletal: Normal range of motion. He exhibits no edema.  Lymphadenopathy:       Head (right side): Tonsillar adenopathy present.       Head (left side): Tonsillar adenopathy present.    He has cervical adenopathy.       Right cervical: Superficial cervical adenopathy present.        Left cervical: Superficial cervical adenopathy present.  Neurological: He is alert and oriented to person, place, and time.  Skin: Skin is warm and dry. He is not diaphoretic.  Psychiatric: He has a normal mood and affect. His behavior is normal.    ED Course  Procedures (including critical care time) Labs Review Labs Reviewed  RAPID STREP SCREEN - Abnormal; Notable for the following:    Streptococcus, Group A Screen (Direct) POSITIVE (*)    All other components within normal limits   Imaging Review No results found.  EKG Interpretation   None       MDM   1. Strep throat   2. Fever     Pt presenting with sore throat, fever, congestion. He appears in NAD. Temp on arrival 102.6, tylenol given with successful reduction of fever to 100.9. He appears dry, tachy ~120 on my exam. Rapid strep positive. No tonsillar abscess. Plan to give IV fluids, toradol, bicillin injection IM. 7:27 AM Pt reports he overall feels better after receiving fluids, however his throat still hurts. He is stable for discharge. Return precautions given. Patient states understanding of treatment care plan and is agreeable.   Trevor Mace, PA-C 03/10/13 [phone]

## 2014-06-23 ENCOUNTER — Ambulatory Visit (HOSPITAL_BASED_OUTPATIENT_CLINIC_OR_DEPARTMENT_OTHER): Payer: 59 | Attending: Anesthesiology | Admitting: Radiology

## 2014-06-23 VITALS — Ht 69.0 in | Wt 250.0 lb

## 2014-06-23 DIAGNOSIS — G4733 Obstructive sleep apnea (adult) (pediatric): Secondary | ICD-10-CM

## 2014-06-23 DIAGNOSIS — R0683 Snoring: Secondary | ICD-10-CM | POA: Diagnosis not present

## 2014-06-23 DIAGNOSIS — G4710 Hypersomnia, unspecified: Secondary | ICD-10-CM | POA: Diagnosis present

## 2014-06-26 DIAGNOSIS — G4733 Obstructive sleep apnea (adult) (pediatric): Secondary | ICD-10-CM | POA: Diagnosis not present

## 2014-06-26 NOTE — Sleep Study (Signed)
   NAME: Patrick Logan DATE OF BIRTH:  12-05-1970 MEDICAL RECORD NUMBER [id number]  LOCATION: [facility]  PHYSICIAN: Brysten Reister D  DATE OF STUDY: 06/23/2014  SLEEP STUDY TYPE: Nocturnal Polysomnogram               REFERRING PHYSICIAN: Tonye RoyaltyGyarteng-Dakwa, Kwadwo,*  INDICATION FOR STUDY: Hypersomnia with sleep apnea  EPWORTH SLEEPINESS SCORE:   2/24 HEIGHT: 5\' 9"  (175.3 cm)  WEIGHT: 113.399 kg (250 lb)    Body mass index is 36.9 kg/(m^2).  NECK SIZE: 19 in.  MEDICATIONS: Charted for review  SLEEP ARCHITECTURE: Split study protocol. During the diagnostic phase, total sleep time 136.5 minutes with sleep efficiency 85.3%. Stage I was 5.9%, stage II 72.5%, stage III 10.6%, REM 11% of total sleep time. Sleep latency 17.5 minutes, REM latency 95.5 minutes, awake after sleep onset 6 minutes, arousal index 22.4, bedtime medication: None  RESPIRATORY DATA: Apnea hypopnea index (AHI) 53.2 per hour. 121 total events scored including 50 obstructive apneas, 1 central apnea, 2 mixed apneas, 68 hypopneas. Most events were while supine. REM AHI 112 per hour. CPAP was titrated to 8 CWP, AHI 0 per hour. He wore a medium nasal pillows mask.  OXYGEN DATA: Moderately loud snoring before CPAP with oxygen desaturation to a nadir of 80%. With CPAP control, snoring was prevented and mean oxygen saturation was 92.3%.  CARDIAC DATA: Sinus rhythm with PACs and PVCs  MOVEMENT/PARASOMNIA: No significant movement disturbance, no bathroom trips  IMPRESSION/ RECOMMENDATION:   1) Severe obstructive sleep apnea/hypopnea syndrome, AHI 53.2 per hour with most events while supine. REM AHI 112 per hour. Moderately loud snoring with oxygen desaturation to a nadir of 80% on room air. 2) Successful CPAP titration to 8 CWP, AHI 0 per hour. He wore a medium ResMed AirFit P-10 nasal pillows mask with heated humidifier. Snoring was prevented and mean oxygen saturation was 92.3%.   [name],Rhianne Soman  D Diplomate, American Board of Sleep Medicine  ELECTRONICALLY SIGNED ON:  06/26/2014, 1:28 PM [facility] PH: [phone]   FX: [phone] ACCREDITED BY THE AMERICAN ACADEMY OF SLEEP MEDICINE

## 2015-02-04 ENCOUNTER — Telehealth: Payer: Self-pay | Admitting: Family Medicine

## 2015-02-04 NOTE — Telephone Encounter (Signed)
Patient's mother is requesting a cpx before the end of year if possible due to change in insurance and advised patient that this is a establish visit and not a CPX visit.

## 2015-02-15 ENCOUNTER — Encounter: Payer: Self-pay | Admitting: Family Medicine

## 2015-02-15 ENCOUNTER — Ambulatory Visit (INDEPENDENT_AMBULATORY_CARE_PROVIDER_SITE_OTHER): Payer: 59 | Admitting: Family Medicine

## 2015-02-15 VITALS — BP 135/100 | HR 98 | Temp 98.7°F | Ht 69.0 in | Wt 258.0 lb

## 2015-02-15 DIAGNOSIS — G4700 Insomnia, unspecified: Secondary | ICD-10-CM

## 2015-02-15 DIAGNOSIS — I10 Essential (primary) hypertension: Secondary | ICD-10-CM

## 2015-02-15 DIAGNOSIS — M25512 Pain in left shoulder: Secondary | ICD-10-CM

## 2015-02-15 DIAGNOSIS — Z23 Encounter for immunization: Secondary | ICD-10-CM | POA: Diagnosis not present

## 2015-02-15 MED ORDER — TRAMADOL HCL 50 MG PO TABS: 100.0000 mg | ORAL_TABLET | Freq: Four times a day (QID) | ORAL | Status: DC | PRN

## 2015-02-15 MED ORDER — HYDROCODONE-ACETAMINOPHEN 10-325 MG PO TABS
1.0000 | ORAL_TABLET | Freq: Two times a day (BID) | ORAL | Status: DC | PRN
Start: 2015-02-15 — End: 2015-03-08

## 2015-02-15 NOTE — Progress Notes (Signed)
Pre visit review using our clinic review tool, if applicable. No additional management support is needed unless otherwise documented below in the visit note. 

## 2015-02-15 NOTE — Progress Notes (Signed)
   Subjective:    Patient ID: Patrick Logan Patrick Logan, male    DOB: 1970/07/24, 44 y.o.   MRN: [id number]  HPI 44 yr old male to establish with us who asks for help with chronic left shoulder pain and with chronic insomnia. He has been seeing Dr. Malon KindleSteven Norris for years for this and has had multiple procedures on it, including 2003, 2008, 2010, and finally a partial replacement surgery in 2014. 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 Dr. Ranell PatrickNorris says it will only be a matter of time until he will require a total shoulder replacement, but since this would require him to miss 6-12 months of work he cannot afford this surgery any time soon. He will delay this as long as possible. Dr. Ranell PatrickNorris had been supplying him with Norco and Tramadol for pain until about 10 months ago, when he said he could no longer treat his pain. So for 10 months [name] has been using only Tylenol. He cannot take any form of NSAID medication because these trigger asthma reactions in his lungs. [name] has not seen any type of primary care provider for at least 4 years now.    Review of Systems  Constitutional: Negative.   Respiratory: Negative.   Cardiovascular: Negative.   Musculoskeletal: Positive for arthralgias. Negative for joint swelling.  Neurological: Negative.        Objective:   Physical Exam  Constitutional: He is oriented to person, place, and time. He appears well-developed and well-nourished.  Neck: No thyromegaly present.  Cardiovascular: Normal rate, regular rhythm, normal heart sounds and intact distal pulses.   Pulmonary/Chest: Effort normal and breath sounds normal. No respiratory distress. He has no wheezes. He has no rales.  Musculoskeletal:  He guards the left shoulder, which is tender both anteriorly and posteriorly. His ROM is very limited by pain.   Lymphadenopathy:    He has no cervical adenopathy.  Neurological: He is alert and oriented to person,  place, and time.          Assessment & Plan:  Chronic left shoulder pain. We will get him back on the medication regimen that helped him up to last year. This includes taking Tramadol 4 times a day, then taking 2 doses of Norco in the evenings. We will get records from Dr. Ranell PatrickNorris' office. [name] will plan to set up a cpx with labs first thing after the holidays.

## 2015-03-03 ENCOUNTER — Telehealth: Payer: Self-pay | Admitting: Family Medicine

## 2015-03-03 NOTE — Telephone Encounter (Signed)
Patient Name: Patrick Logan  DOB: 1970/12/16    Initial Comment Caller states he has had upper respiratory sx for 10 days, wants to know if he can take abx from 6 months ago. Also needs to know what to take for cough.   Nurse Assessment  Nurse: Stefano GaulStringer, RN, Dwana CurdVera Date/Time (Eastern Time): 03/03/2015 10:49:37 AM  Confirm and document reason for call. If symptomatic, describe symptoms. ---Caller states he has nasal congestion for 10 days. Has gotten worse the past few days. Has sinus pressure and his ears are stopped up. Has amoxicillin from 6 months ago. Has had fever off and on. Has a lot of coughing.  Has the patient traveled out of the country within the last 30 days? ---No  Does the patient have any new or worsening symptoms? ---Yes  Will a triage be completed? ---Yes  Related visit to physician within the last 2 weeks? ---No  Does the PT have any chronic conditions? (i.e. diabetes, asthma, etc.) ---Yes  List chronic conditions. ---asthma  Is this a behavioral health or substance abuse call? ---No     Guidelines    Guideline Title Affirmed Question Affirmed Notes  Sinus Pain or Congestion [1] Redness or swelling on the cheek, forehead or around the eye AND [2] no fever    Final Disposition User   See Physician within 4 Hours (or PCP triage) Stefano GaulStringer, RN, Vera    Comments  [facility] within 4 hrs and pt is not sure if his insurance pays for urgent care and is not sure about going to another office because of insurance. He has a prescription for amoxicillin that he got 6 months ago for possible strep and did not have strep so he did not take it. Please call pt back about amoxicillin.   Referrals  GO TO FACILITY REFUSED   Disagree/Comply: Disagree  Disagree/Comply Reason: Disagree with instructions

## 2015-03-03 NOTE — Telephone Encounter (Signed)
Pt pharm is [facility] on [address]

## 2015-03-03 NOTE — Telephone Encounter (Signed)
Called and spoke with pt and pt is aware of Dr. Claris CheFry's recommendations. Pt states he has tried all the OTC cough meds and it is not helping with his cough.  Pt would like to know if Dr. Clent RidgesFry had any other recommendations.  Advised pt that Dr. Clent RidgesFry was out of the office until tomorrow morning but the message would be sent to him.  Pt verbalized understanding.

## 2015-03-03 NOTE — Telephone Encounter (Signed)
Yes he can try the Amoxicillin. Use Delsym for cough. See us if he does not improve

## 2015-03-04 NOTE — Telephone Encounter (Signed)
If he desires a prescription cough med he would need an OV to be evaluated first

## 2015-03-04 NOTE — Telephone Encounter (Signed)
Called and spoke with pt and pt is aware of Dr. Claris CheFry's recommendations.

## 2015-03-08 ENCOUNTER — Telehealth: Payer: Self-pay | Admitting: Family Medicine

## 2015-03-08 MED ORDER — HYDROCODONE-ACETAMINOPHEN 10-325 MG PO TABS: 1.0000 | ORAL_TABLET | Freq: Three times a day (TID) | ORAL | Status: DC | PRN

## 2015-03-08 NOTE — Telephone Encounter (Signed)
Done ( I wrote for 3 a day) 

## 2015-03-08 NOTE — Addendum Note (Signed)
Addended by: Gershon CraneFRY, STEPHEN A on: 03/08/2015 05:11 PM   Modules accepted: Orders

## 2015-03-09 NOTE — Telephone Encounter (Signed)
Sent pt a my chart message, script is ready for pick up here at front office.  

## 2015-03-15 ENCOUNTER — Other Ambulatory Visit: Payer: Self-pay | Admitting: Family Medicine

## 2015-03-15 NOTE — Telephone Encounter (Signed)
Call in #240 with 2 rf 

## 2015-03-16 MED ORDER — TRAMADOL HCL 50 MG PO TABS: 100.0000 mg | ORAL_TABLET | Freq: Four times a day (QID) | ORAL | Status: DC | PRN

## 2015-03-16 NOTE — Telephone Encounter (Signed)
Call in #240 with no rf

## 2015-03-16 NOTE — Addendum Note (Signed)
Addended by: Aniceto Boss A on: 03/16/2015 01:17 PM   Modules accepted: Orders

## 2015-03-22 ENCOUNTER — Encounter: Payer: Self-pay | Admitting: Family Medicine

## 2015-03-22 ENCOUNTER — Ambulatory Visit (INDEPENDENT_AMBULATORY_CARE_PROVIDER_SITE_OTHER): Payer: BLUE CROSS/BLUE SHIELD | Admitting: Family Medicine

## 2015-03-22 VITALS — BP 138/88 | HR 80 | Temp 98.4°F | Ht 69.0 in | Wt 250.0 lb

## 2015-03-22 DIAGNOSIS — Z0000 Encounter for general adult medical examination without abnormal findings: Secondary | ICD-10-CM

## 2015-03-22 LAB — HEPATIC FUNCTION PANEL
ALBUMIN: 4.3 g/dL (ref 3.5–5.2)
ALK PHOS: 98 U/L (ref 39–117)
ALT: 30 U/L (ref 0–53)
AST: 22 U/L (ref 0–37)
BILIRUBIN DIRECT: 0.1 mg/dL (ref 0.0–0.3)
TOTAL PROTEIN: 7.3 g/dL (ref 6.0–8.3)
Total Bilirubin: 0.4 mg/dL (ref 0.2–1.2)

## 2015-03-22 LAB — CBC WITH DIFFERENTIAL/PLATELET
Basophils Absolute: 0 10*3/uL (ref 0.0–0.1)
Basophils Relative: 0.3 % (ref 0.0–3.0)
EOS ABS: 0.2 10*3/uL (ref 0.0–0.7)
Eosinophils Relative: 1.6 % (ref 0.0–5.0)
HCT: 47.5 % (ref 39.0–52.0)
HEMOGLOBIN: 15.3 g/dL (ref 13.0–17.0)
Lymphocytes Relative: 41 % (ref 12.0–46.0)
Lymphs Abs: 4.1 10*3/uL — ABNORMAL HIGH (ref 0.7–4.0)
MCHC: 32.2 g/dL (ref 30.0–36.0)
MCV: 88.9 fl (ref 78.0–100.0)
MONO ABS: 0.7 10*3/uL (ref 0.1–1.0)
Monocytes Relative: 6.6 % (ref 3.0–12.0)
NEUTROS PCT: 50.5 % (ref 43.0–77.0)
Neutro Abs: 5 10*3/uL (ref 1.4–7.7)
Platelets: 360 10*3/uL (ref 150.0–400.0)
RBC: 5.34 Mil/uL (ref 4.22–5.81)
RDW: 15.1 % (ref 11.5–15.5)
WBC: 10 10*3/uL (ref 4.0–10.5)

## 2015-03-22 LAB — TSH: TSH: 0.69 u[IU]/mL (ref 0.35–4.50)

## 2015-03-22 LAB — BASIC METABOLIC PANEL
BUN: 17 mg/dL (ref 6–23)
CHLORIDE: 103 meq/L (ref 96–112)
CO2: 28 meq/L (ref 19–32)
CREATININE: 0.95 mg/dL (ref 0.40–1.50)
Calcium: 9.6 mg/dL (ref 8.4–10.5)
GFR: 91.43 mL/min (ref >60.00)
GLUCOSE: 95 mg/dL (ref 70–99)
Potassium: 5.5 mEq/L — ABNORMAL HIGH (ref 3.5–5.1)
SODIUM: 140 meq/L (ref 135–145)

## 2015-03-22 LAB — LIPID PANEL
CHOLESTEROL: 212 mg/dL — AB (ref 0–200)
HDL: 57.7 mg/dL (ref >39.00)
LDL Cholesterol: 130 mg/dL — ABNORMAL HIGH (ref 0–99)
NONHDL: 154.59
Total CHOL/HDL Ratio: 4
Triglycerides: 121 mg/dL (ref 0.0–149.0)
VLDL: 24.2 mg/dL (ref 0.0–40.0)

## 2015-03-22 LAB — POCT URINALYSIS DIPSTICK
Bilirubin, UA: NEGATIVE
Glucose, UA: NEGATIVE
Ketones, UA: NEGATIVE
Leukocytes, UA: NEGATIVE
NITRITE UA: NEGATIVE
PH UA: 5
Protein, UA: NEGATIVE
RBC UA: NEGATIVE
Spec Grav, UA: >=1.03
UROBILINOGEN UA: 0.2

## 2015-03-22 MED ORDER — TEMAZEPAM 30 MG PO CAPS: 30.0000 mg | ORAL_CAPSULE | Freq: Every evening | ORAL | Status: DC | PRN

## 2015-03-22 MED ORDER — ALBUTEROL SULFATE HFA 108 (90 BASE) MCG/ACT IN AERS: 2.0000 | INHALATION_SPRAY | RESPIRATORY_TRACT | Status: DC | PRN

## 2015-03-22 NOTE — Progress Notes (Signed)
   Subjective:    Patient ID: Patrick Logan, male    DOB: 09/21/1970, 45 y.o.   MRN: [id number]  HPI 45 yr old male for a cpx. He has a few issues to discuss. His various joint pains are fairly well controlled now but he still has trouble sleeping every night. Of note he has tried Ambien and Remeron in the past with poor results. Also his asthm ahas been bothering him a bit lately and he asks for a fresh inhaler.    Review of Systems  Constitutional: Negative.   HENT: Negative.   Eyes: Negative.   Respiratory: Positive for shortness of breath and wheezing. Negative for apnea, cough, choking, chest tightness and stridor.   Cardiovascular: Negative.   Gastrointestinal: Negative.   Genitourinary: Negative.   Musculoskeletal: Negative.   Skin: Negative.   Neurological: Negative.   Psychiatric/Behavioral: Positive for sleep disturbance. Negative for dysphoric mood. The patient is not nervous/anxious.        Objective:   Physical Exam  Constitutional: He is oriented to person, place, and time. He appears well-developed and well-nourished. No distress.  HENT:  Head: Normocephalic and atraumatic.  Right Ear: External ear normal.  Left Ear: External ear normal.  Nose: Nose normal.  Mouth/Throat: Oropharynx is clear and moist. No oropharyngeal exudate.  Eyes: Conjunctivae and EOM are normal. Pupils are equal, round, and reactive to light. Right eye exhibits no discharge. Left eye exhibits no discharge. No scleral icterus.  Neck: Neck supple. No JVD present. No tracheal deviation present. No thyromegaly present.  Cardiovascular: Normal rate, regular rhythm, normal heart sounds and intact distal pulses.  Exam reveals no gallop and no friction rub.   No murmur heard. Pulmonary/Chest: Effort normal and breath sounds normal. No respiratory distress. He has no wheezes. He has no rales. He exhibits no tenderness.  Abdominal: Soft. Bowel sounds are normal. He exhibits no distension and no  mass. There is no tenderness. There is no rebound and no guarding.  Genitourinary: Rectum normal, prostate normal and penis normal. Guaiac negative stool. No penile tenderness.  Musculoskeletal: Normal range of motion. He exhibits no edema or tenderness.  Lymphadenopathy:    He has no cervical adenopathy.  Neurological: He is alert and oriented to person, place, and time. He has normal reflexes. No cranial nerve deficit. He exhibits normal muscle tone. Coordination normal.  Skin: Skin is warm and dry. No rash noted. He is not diaphoretic. No erythema. No pallor.  Psychiatric: He has a normal mood and affect. His behavior is normal. Judgment and thought content normal.          Assessment & Plan:  Well exam. We discussed diet and exercise. Get fasting labs. Refilled the albuterol inhaler. Try Temazepam for sleep.

## 2015-03-22 NOTE — Progress Notes (Signed)
Pre visit review using our clinic review tool, if applicable. No additional management support is needed unless otherwise documented below in the visit note. 

## 2015-03-31 ENCOUNTER — Other Ambulatory Visit: Payer: Self-pay | Admitting: Family Medicine

## 2015-03-31 NOTE — Telephone Encounter (Signed)
NO this is not due until 04-08-15

## 2015-04-04 ENCOUNTER — Telehealth: Payer: Self-pay | Admitting: Family Medicine

## 2015-04-04 MED ORDER — HYDROCODONE-ACETAMINOPHEN 10-325 MG PO TABS: 1.0000 | ORAL_TABLET | Freq: Three times a day (TID) | ORAL | Status: DC | PRN

## 2015-04-04 NOTE — Telephone Encounter (Signed)
Called and spoke with pt and pt is aware rx is ready for pick up.  

## 2015-04-04 NOTE — Addendum Note (Signed)
Addended by: Gershon Crane A on: 04/04/2015 04:24 PM   Modules accepted: Orders

## 2015-04-04 NOTE — Telephone Encounter (Signed)
done

## 2015-04-12 ENCOUNTER — Other Ambulatory Visit: Payer: Self-pay | Admitting: Family Medicine

## 2015-04-14 ENCOUNTER — Other Ambulatory Visit: Payer: Self-pay | Admitting: Family Medicine

## 2015-04-14 NOTE — Telephone Encounter (Signed)
Ok to refill 

## 2015-04-14 NOTE — Telephone Encounter (Signed)
Call in #240 with 2 rf 

## 2015-04-15 ENCOUNTER — Telehealth: Payer: Self-pay | Admitting: Family Medicine

## 2015-04-15 NOTE — Telephone Encounter (Signed)
Pt requesting traMADol (ULTRAM) 50 MG tablet Pt last visit 01/20/16 Pt last refill 03/16/15 #240 no refills

## 2015-04-25 ENCOUNTER — Encounter: Payer: Self-pay | Admitting: Family Medicine

## 2015-04-25 MED ORDER — HYDROCODONE-ACETAMINOPHEN 10-325 MG PO TABS: 1.0000 | ORAL_TABLET | Freq: Three times a day (TID) | ORAL | Status: DC | PRN

## 2015-04-25 NOTE — Telephone Encounter (Signed)
The refill is ready to pick up. As for a referral, I would need the name of a doctor that he would be seeing. I was not aware that we referred to this clinic

## 2015-04-27 NOTE — Telephone Encounter (Signed)
Patient notified that Rx is ready for pick up.

## 2015-04-27 NOTE — Telephone Encounter (Signed)
Patient is calling to pick up his prescription today.

## 2015-05-17 ENCOUNTER — Telehealth: Payer: Self-pay | Admitting: Family Medicine

## 2015-05-19 ENCOUNTER — Telehealth: Payer: Self-pay | Admitting: *Deleted

## 2015-05-19 MED ORDER — HYDROCODONE-ACETAMINOPHEN 10-325 MG PO TABS: 1.0000 | ORAL_TABLET | Freq: Three times a day (TID) | ORAL | Status: DC | PRN

## 2015-05-19 NOTE — Addendum Note (Signed)
Addended by: Gershon CraneFRY, STEPHEN A on: 05/19/2015 08:46 AM   Modules accepted: Orders

## 2015-05-19 NOTE — Telephone Encounter (Signed)
I called the pt and informed him the Rx for Hydrocodone was left at the front desk for him to pick up. 

## 2015-05-19 NOTE — Telephone Encounter (Signed)
done

## 2015-06-08 ENCOUNTER — Other Ambulatory Visit: Payer: Self-pay | Admitting: Family Medicine

## 2015-06-08 NOTE — Telephone Encounter (Signed)
Dr fry not in office  Last rx 3 /23/27  90    Request a week early   On a prn med  Please have dr fry  Refill this next week

## 2015-06-08 NOTE — Telephone Encounter (Signed)
Will route to Dr. Claris CheFry's CMA to refill next week upon Dr. Claris CheFry's return.

## 2015-06-09 ENCOUNTER — Other Ambulatory Visit: Payer: Self-pay | Admitting: Family Medicine

## 2015-06-13 NOTE — Telephone Encounter (Signed)
I sent pt a my chart message with below information.  

## 2015-06-13 NOTE — Telephone Encounter (Signed)
NO too early. Not due until 06-19-15.

## 2015-06-13 NOTE — Telephone Encounter (Signed)
We will address this on Thursday.

## 2015-06-16 ENCOUNTER — Ambulatory Visit (INDEPENDENT_AMBULATORY_CARE_PROVIDER_SITE_OTHER): Payer: BLUE CROSS/BLUE SHIELD | Admitting: Family Medicine

## 2015-06-16 ENCOUNTER — Encounter: Payer: Self-pay | Admitting: Family Medicine

## 2015-06-16 VITALS — BP 138/95 | HR 81 | Temp 99.1°F | Ht 69.0 in | Wt 247.0 lb

## 2015-06-16 DIAGNOSIS — Z9109 Other allergy status, other than to drugs and biological substances: Secondary | ICD-10-CM | POA: Insufficient documentation

## 2015-06-16 DIAGNOSIS — M19012 Primary osteoarthritis, left shoulder: Secondary | ICD-10-CM

## 2015-06-16 DIAGNOSIS — K219 Gastro-esophageal reflux disease without esophagitis: Secondary | ICD-10-CM

## 2015-06-16 DIAGNOSIS — Z91048 Other nonmedicinal substance allergy status: Secondary | ICD-10-CM | POA: Diagnosis not present

## 2015-06-16 DIAGNOSIS — F418 Other specified anxiety disorders: Secondary | ICD-10-CM

## 2015-06-16 MED ORDER — OMEPRAZOLE 40 MG PO CPDR: 40.0000 mg | DELAYED_RELEASE_CAPSULE | Freq: Every day | ORAL | Status: DC

## 2015-06-16 MED ORDER — FLUOXETINE HCL 40 MG PO CAPS: 40.0000 mg | ORAL_CAPSULE | Freq: Every day | ORAL | Status: DC

## 2015-06-16 MED ORDER — HYDROCODONE-ACETAMINOPHEN 10-325 MG PO TABS: 1.0000 | ORAL_TABLET | Freq: Three times a day (TID) | ORAL | Status: DC | PRN

## 2015-06-16 NOTE — Progress Notes (Signed)
Pre visit review using our clinic review tool, if applicable. No additional management support is needed unless otherwise documented below in the visit note. 

## 2015-06-16 NOTE — Progress Notes (Signed)
   Subjective:    Patient ID: Patrick Logan, male    DOB: May 16, 1970, 45 y.o.   MRN: [id number]  HPI Here to discuss several issues. First the chronic shoulder pain has been a little worse since he is working longer hours than ever before. He would like to try taking Norco every 6 hours instead of every 8 because it wears off too quickly. He wants to increase the dose of his GERD medication. He has been more stressed with work lately and he wants to get back on Prozac. Also his allergies are worse than ever despite using Claritin and Flonase every day.    Review of Systems  Constitutional: Negative.   HENT: Positive for congestion, postnasal drip, rhinorrhea, sinus pressure and sneezing. Negative for sore throat.   Eyes: Positive for itching.  Respiratory: Negative.   Cardiovascular: Negative.   Musculoskeletal: Positive for arthralgias.  Neurological: Negative.   Psychiatric/Behavioral: Positive for dysphoric mood. The patient is nervous/anxious.        Objective:   Physical Exam  Constitutional: He is oriented to person, place, and time. He appears well-developed and well-nourished.  Neck: No thyromegaly present.  Cardiovascular: Normal rate, regular rhythm, normal heart sounds and intact distal pulses.   Pulmonary/Chest: Effort normal and breath sounds normal.  Lymphadenopathy:    He has no cervical adenopathy.  Neurological: He is alert and oriented to person, place, and time.  Psychiatric: He has a normal mood and affect. His behavior is normal. Thought content normal.          Assessment & Plan:  For the shoulder pain we will increase Norco to take every 6 hours. Refer to Allergy for possible desensitization therapy. Increase Omeprazole to 40 mg daily. Start back on Prozac.  Nelwyn SalisburyFRY,STEPHEN A, MD

## 2015-06-23 ENCOUNTER — Other Ambulatory Visit: Payer: Self-pay | Admitting: Family Medicine

## 2015-06-23 ENCOUNTER — Encounter: Payer: Self-pay | Admitting: Family Medicine

## 2015-06-24 NOTE — Telephone Encounter (Signed)
No it is too early. Not due until May 17

## 2015-06-27 MED ORDER — TRAMADOL HCL 50 MG PO TABS: 100.0000 mg | ORAL_TABLET | Freq: Four times a day (QID) | ORAL | Status: DC | PRN

## 2015-06-27 NOTE — Telephone Encounter (Signed)
Call in Tramadol #240 with no rf

## 2015-07-12 ENCOUNTER — Telehealth: Payer: Self-pay | Admitting: Family Medicine

## 2015-07-12 MED ORDER — HYDROCODONE-ACETAMINOPHEN 10-325 MG PO TABS: 1.0000 | ORAL_TABLET | Freq: Three times a day (TID) | ORAL | Status: DC | PRN

## 2015-07-12 NOTE — Telephone Encounter (Signed)
done

## 2015-07-12 NOTE — Telephone Encounter (Signed)
Script is ready for pick up and I sent pt a my chart message.  

## 2015-07-13 MED ORDER — HYDROCODONE-ACETAMINOPHEN 10-325 MG PO TABS
1.0000 | ORAL_TABLET | Freq: Four times a day (QID) | ORAL | Status: DC | PRN
Start: 2015-07-13 — End: 2015-08-10

## 2015-07-13 NOTE — Addendum Note (Signed)
Addended by: Gershon CraneFRY, STEPHEN A on: 07/13/2015 09:21 AM   Modules accepted: Orders

## 2015-07-19 ENCOUNTER — Encounter: Payer: Self-pay | Admitting: Family Medicine

## 2015-07-19 ENCOUNTER — Ambulatory Visit (INDEPENDENT_AMBULATORY_CARE_PROVIDER_SITE_OTHER): Payer: BLUE CROSS/BLUE SHIELD | Admitting: Family Medicine

## 2015-07-19 VITALS — HR 111 | Temp 98.2°F | Ht 69.0 in | Wt 251.0 lb

## 2015-07-19 DIAGNOSIS — T63301A Toxic effect of unspecified spider venom, accidental (unintentional), initial encounter: Secondary | ICD-10-CM | POA: Diagnosis not present

## 2015-07-19 MED ORDER — DOXYCYCLINE HYCLATE 100 MG PO CAPS: 100.0000 mg | ORAL_CAPSULE | Freq: Two times a day (BID) | ORAL | Status: AC

## 2015-07-19 NOTE — Progress Notes (Signed)
Pre visit review using our clinic review tool, if applicable. No additional management support is needed unless otherwise documented below in the visit note. 

## 2015-07-19 NOTE — Progress Notes (Signed)
   Subjective:    Patient ID: Patrick Logan, male    DOB: May 13, 1970, 45 y.o.   MRN: [id number]  HPI Here for 4 apparent bite marks on his chest that appeared 2 weeks ago. 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 All 4 marks aooeared at the same time, so this does not seem to be spreading. The spots have grown a little larger and they are painful . No systemic symptoms, no fever or headaches.    Review of Systems  Constitutional: Negative.   Skin: Positive for wound.       Objective:   Physical Exam  Constitutional: He appears well-developed and well-nourished. No distress.  Skin:  There are four red tender papular lesions on the left chest which are scabbed over          Assessment & Plan:  These are consistent with spider bites, most likely from a brown recluse spider. Cover with Doxycycline for 10 days. He can apply witch hazel to the skin to ease the burning.  Nelwyn SalisburyFRY,Jaycelyn Orrison A, MD

## 2015-07-22 ENCOUNTER — Other Ambulatory Visit: Payer: Self-pay | Admitting: Family Medicine

## 2015-07-26 ENCOUNTER — Other Ambulatory Visit: Payer: Self-pay | Admitting: Family Medicine

## 2015-07-26 NOTE — Telephone Encounter (Signed)
Call in #240 with 2 rf 

## 2015-07-27 ENCOUNTER — Other Ambulatory Visit: Payer: Self-pay | Admitting: Family Medicine

## 2015-08-02 ENCOUNTER — Other Ambulatory Visit: Payer: Self-pay | Admitting: General Practice

## 2015-08-02 MED ORDER — TRAMADOL HCL 50 MG PO TABS: 100.0000 mg | ORAL_TABLET | Freq: Four times a day (QID) | ORAL | Status: DC | PRN

## 2015-08-03 NOTE — Telephone Encounter (Signed)
Medication was called in on 07/27/15.

## 2015-08-10 ENCOUNTER — Other Ambulatory Visit: Payer: Self-pay | Admitting: Family Medicine

## 2015-08-11 MED ORDER — HYDROCODONE-ACETAMINOPHEN 10-325 MG PO TABS: 1.0000 | ORAL_TABLET | Freq: Four times a day (QID) | ORAL | Status: DC | PRN

## 2015-08-11 NOTE — Addendum Note (Signed)
Addended by: Gershon CraneFRY, STEPHEN A on: 08/11/2015 08:28 AM   Modules accepted: Orders

## 2015-08-11 NOTE — Telephone Encounter (Signed)
Patient notified Rx is ready for pick-up. 

## 2015-08-11 NOTE — Telephone Encounter (Signed)
done

## 2015-10-06 ENCOUNTER — Other Ambulatory Visit: Payer: Self-pay | Admitting: Family Medicine

## 2015-10-21 ENCOUNTER — Encounter: Payer: Self-pay | Admitting: Family Medicine

## 2015-10-21 ENCOUNTER — Other Ambulatory Visit: Payer: Self-pay | Admitting: Family Medicine

## 2015-10-21 NOTE — Telephone Encounter (Signed)
Pt request refill  traMADol (ULTRAM) 50 MG tablet temazepam (RESTORIL) 30 MG capsule  Pt sent mychart message on tues and is out. [facility]/ [address]

## 2015-10-24 ENCOUNTER — Encounter: Payer: Self-pay | Admitting: Family Medicine

## 2015-10-24 MED ORDER — TRAMADOL HCL 50 MG PO TABS: 100.0000 mg | ORAL_TABLET | Freq: Four times a day (QID) | ORAL | 5 refills | Status: DC | PRN

## 2015-10-24 MED ORDER — TEMAZEPAM 30 MG PO CAPS: 30.0000 mg | ORAL_CAPSULE | Freq: Every evening | ORAL | 5 refills | Status: DC | PRN

## 2015-10-24 NOTE — Telephone Encounter (Signed)
Call in Tramadol #240 with 5 rf, also Temazepam #30 with 5 rf

## 2015-10-24 NOTE — Telephone Encounter (Signed)
duplicate

## 2015-10-24 NOTE — Telephone Encounter (Signed)
Both Rxs called in.

## 2015-10-24 NOTE — Telephone Encounter (Signed)
Pt following up on refil request. He has been out all weekend.

## 2015-11-02 ENCOUNTER — Other Ambulatory Visit: Payer: Self-pay | Admitting: Family Medicine

## 2015-11-03 NOTE — Telephone Encounter (Signed)
Tell him I understand what he is saying but that we are watched very closely by the state when prescribing these medications and we cannot fill them early. It is his responsibility to take them only as directed

## 2015-11-06 ENCOUNTER — Other Ambulatory Visit: Payer: Self-pay | Admitting: Family Medicine

## 2015-11-08 ENCOUNTER — Encounter: Payer: Self-pay | Admitting: Family Medicine

## 2015-11-08 ENCOUNTER — Ambulatory Visit (INDEPENDENT_AMBULATORY_CARE_PROVIDER_SITE_OTHER): Payer: BLUE CROSS/BLUE SHIELD | Admitting: Family Medicine

## 2015-11-08 ENCOUNTER — Other Ambulatory Visit: Payer: Self-pay

## 2015-11-08 ENCOUNTER — Other Ambulatory Visit: Payer: Self-pay | Admitting: Family Medicine

## 2015-11-08 VITALS — BP 160/102 | HR 95 | Temp 97.9°F | Ht 69.0 in | Wt 254.2 lb

## 2015-11-08 DIAGNOSIS — M25512 Pain in left shoulder: Secondary | ICD-10-CM

## 2015-11-08 DIAGNOSIS — M19012 Primary osteoarthritis, left shoulder: Secondary | ICD-10-CM | POA: Diagnosis not present

## 2015-11-08 DIAGNOSIS — F418 Other specified anxiety disorders: Secondary | ICD-10-CM

## 2015-11-08 DIAGNOSIS — Z23 Encounter for immunization: Secondary | ICD-10-CM

## 2015-11-08 DIAGNOSIS — F410 Panic disorder [episodic paroxysmal anxiety] without agoraphobia: Secondary | ICD-10-CM | POA: Diagnosis not present

## 2015-11-08 MED ORDER — CLONAZEPAM 1 MG PO TABS: 1.0000 mg | ORAL_TABLET | Freq: Two times a day (BID) | ORAL | 2 refills | Status: DC | PRN

## 2015-11-08 MED ORDER — HYDROCODONE-ACETAMINOPHEN 10-325 MG PO TABS: 1.0000 | ORAL_TABLET | Freq: Four times a day (QID) | ORAL | 0 refills | Status: DC | PRN

## 2015-11-08 NOTE — Telephone Encounter (Signed)
done

## 2015-11-08 NOTE — Progress Notes (Signed)
   Subjective:    Patient ID: Patrick Logan, male    DOB: Mar 10, 1970, 45 y.o.   MRN: [id number]  HPI Here to discuss his chronic left shoulder pain and also his anixety and panic disorder. He had used up his last supply of Norco a few days early because he had run out of Tramadol. Now he asks for another month supply of Norco. Also his anxiety has been more of a problem this summer because of issues dealing with his 45 year old son who has autism. He has had panic attacks anywhere from 1 day to 6 days a week, and he asks for something to use on a prn basis. He used Clonazepam successfully in the past. He is still taking Prozac 40 mg daily.    Review of Systems  Constitutional: Negative.   Respiratory: Negative.   Cardiovascular: Negative.   Musculoskeletal: Positive for arthralgias.  Psychiatric/Behavioral: Positive for decreased concentration. Negative for agitation, confusion and dysphoric mood. The patient is nervous/anxious.        Objective:   Physical Exam  Constitutional: He is oriented to person, place, and time. He appears well-developed and well-nourished.  Cardiovascular: Normal rate, regular rhythm, normal heart sounds and intact distal pulses.   Pulmonary/Chest: Effort normal and breath sounds normal.  Neurological: He is alert and oriented to person, place, and time.  Psychiatric: His behavior is normal. Thought content normal.  Quite anxious           Assessment & Plan:  For the shoulder pain we will go ahead and refill the Norco for 30 days today. I suggested he get involved with a pain management clinic and he agreed, so I will order a referral for this. For the anxiety and panic attacks he will try Clonazepam 1 mg as needed.  Nelwyn SalisburyFRY,Teffany Blaszczyk A, MD

## 2015-11-09 NOTE — Telephone Encounter (Signed)
Patient seen by Dr. Clent RidgesFry

## 2015-11-09 NOTE — Telephone Encounter (Signed)
Patient seen by Dr. Fry 

## 2015-12-06 ENCOUNTER — Other Ambulatory Visit: Payer: Self-pay | Admitting: Family Medicine

## 2015-12-07 ENCOUNTER — Encounter: Payer: Self-pay | Admitting: Family Medicine

## 2015-12-07 MED ORDER — HYDROCODONE-ACETAMINOPHEN 10-325 MG PO TABS: 1.0000 | ORAL_TABLET | Freq: Four times a day (QID) | ORAL | 0 refills | Status: DC | PRN

## 2015-12-07 NOTE — Telephone Encounter (Signed)
done

## 2015-12-08 NOTE — Telephone Encounter (Signed)
Done yesterday.

## 2016-01-05 ENCOUNTER — Other Ambulatory Visit: Payer: Self-pay | Admitting: Family Medicine

## 2016-01-05 MED ORDER — HYDROCODONE-ACETAMINOPHEN 10-325 MG PO TABS: 1.0000 | ORAL_TABLET | Freq: Four times a day (QID) | ORAL | 0 refills | Status: DC | PRN

## 2016-01-05 NOTE — Telephone Encounter (Signed)
done

## 2016-01-06 NOTE — Telephone Encounter (Signed)
Pt calling to check the status of the Rx.  I called Nettie ElmSylvia and she state it is ready for pick-up and will send it up front for pick-up.

## 2016-02-06 ENCOUNTER — Other Ambulatory Visit: Payer: Self-pay | Admitting: Family Medicine

## 2016-02-06 MED ORDER — HYDROCODONE-ACETAMINOPHEN 10-325 MG PO TABS: 1.0000 | ORAL_TABLET | Freq: Four times a day (QID) | ORAL | 0 refills | Status: DC | PRN

## 2016-02-06 NOTE — Telephone Encounter (Signed)
Script is ready for pick up here at front office and I spoke with pt.  

## 2016-02-06 NOTE — Telephone Encounter (Signed)
done

## 2016-02-07 ENCOUNTER — Other Ambulatory Visit: Payer: Self-pay | Admitting: Family Medicine

## 2016-02-07 NOTE — Telephone Encounter (Signed)
Call in #60 with 5 rf 

## 2016-02-07 NOTE — Telephone Encounter (Signed)
Last seen and filled on 11/08/15. Filled for 3 months No future appt. Please advise. Thanks!!

## 2016-02-08 NOTE — Telephone Encounter (Signed)
Called to the pharmacy and left on machine. 

## 2016-03-07 ENCOUNTER — Telehealth: Payer: Self-pay | Admitting: Family Medicine

## 2016-03-09 ENCOUNTER — Emergency Department (HOSPITAL_COMMUNITY): Payer: BLUE CROSS/BLUE SHIELD

## 2016-03-09 ENCOUNTER — Inpatient Hospital Stay (HOSPITAL_COMMUNITY)
Admission: EM | Admit: 2016-03-09 | Discharge: 2016-03-11 | DRG: 917 | Disposition: A | Discharge status: Home / Self Care | Payer: BLUE CROSS/BLUE SHIELD | Attending: Internal Medicine | Admitting: Internal Medicine

## 2016-03-09 ENCOUNTER — Encounter (HOSPITAL_COMMUNITY): Payer: Self-pay | Admitting: Emergency Medicine

## 2016-03-09 DIAGNOSIS — F1120 Opioid dependence, uncomplicated: Secondary | ICD-10-CM | POA: Diagnosis present

## 2016-03-09 DIAGNOSIS — E861 Hypovolemia: Secondary | ICD-10-CM | POA: Diagnosis present

## 2016-03-09 DIAGNOSIS — G9340 Encephalopathy, unspecified: Secondary | ICD-10-CM

## 2016-03-09 DIAGNOSIS — K219 Gastro-esophageal reflux disease without esophagitis: Secondary | ICD-10-CM | POA: Diagnosis present

## 2016-03-09 DIAGNOSIS — Z886 Allergy status to analgesic agent status: Secondary | ICD-10-CM

## 2016-03-09 DIAGNOSIS — R7309 Other abnormal glucose: Secondary | ICD-10-CM | POA: Diagnosis present

## 2016-03-09 DIAGNOSIS — F418 Other specified anxiety disorders: Secondary | ICD-10-CM | POA: Diagnosis present

## 2016-03-09 DIAGNOSIS — Z6834 Body mass index (BMI) 34.0-34.9, adult: Secondary | ICD-10-CM

## 2016-03-09 DIAGNOSIS — T424X1A Poisoning by benzodiazepines, accidental (unintentional), initial encounter: Principal | ICD-10-CM | POA: Diagnosis present

## 2016-03-09 DIAGNOSIS — F05 Delirium due to known physiological condition: Secondary | ICD-10-CM | POA: Diagnosis present

## 2016-03-09 DIAGNOSIS — F19931 Other psychoactive substance use, unspecified with withdrawal delirium: Secondary | ICD-10-CM | POA: Diagnosis present

## 2016-03-09 DIAGNOSIS — J690 Pneumonitis due to inhalation of food and vomit: Secondary | ICD-10-CM | POA: Diagnosis present

## 2016-03-09 DIAGNOSIS — G92 Toxic encephalopathy: Secondary | ICD-10-CM | POA: Diagnosis present

## 2016-03-09 DIAGNOSIS — R197 Diarrhea, unspecified: Secondary | ICD-10-CM | POA: Diagnosis present

## 2016-03-09 DIAGNOSIS — F329 Major depressive disorder, single episode, unspecified: Secondary | ICD-10-CM | POA: Diagnosis present

## 2016-03-09 DIAGNOSIS — G894 Chronic pain syndrome: Secondary | ICD-10-CM | POA: Diagnosis present

## 2016-03-09 DIAGNOSIS — M25519 Pain in unspecified shoulder: Secondary | ICD-10-CM | POA: Diagnosis present

## 2016-03-09 DIAGNOSIS — T405X1A Poisoning by cocaine, accidental (unintentional), initial encounter: Secondary | ICD-10-CM | POA: Diagnosis present

## 2016-03-09 DIAGNOSIS — J9601 Acute respiratory failure with hypoxia: Secondary | ICD-10-CM | POA: Diagnosis not present

## 2016-03-09 DIAGNOSIS — R451 Restlessness and agitation: Secondary | ICD-10-CM | POA: Diagnosis present

## 2016-03-09 DIAGNOSIS — Y92410 Unspecified street and highway as the place of occurrence of the external cause: Secondary | ICD-10-CM | POA: Diagnosis not present

## 2016-03-09 DIAGNOSIS — M25512 Pain in left shoulder: Secondary | ICD-10-CM | POA: Diagnosis present

## 2016-03-09 DIAGNOSIS — G4733 Obstructive sleep apnea (adult) (pediatric): Secondary | ICD-10-CM | POA: Diagnosis present

## 2016-03-09 DIAGNOSIS — R739 Hyperglycemia, unspecified: Secondary | ICD-10-CM | POA: Diagnosis present

## 2016-03-09 DIAGNOSIS — Z79899 Other long term (current) drug therapy: Secondary | ICD-10-CM

## 2016-03-09 DIAGNOSIS — Z781 Physical restraint status: Secondary | ICD-10-CM | POA: Diagnosis not present

## 2016-03-09 DIAGNOSIS — G4700 Insomnia, unspecified: Secondary | ICD-10-CM | POA: Diagnosis present

## 2016-03-09 DIAGNOSIS — Z87891 Personal history of nicotine dependence: Secondary | ICD-10-CM

## 2016-03-09 DIAGNOSIS — J9600 Acute respiratory failure, unspecified whether with hypoxia or hypercapnia: Secondary | ICD-10-CM

## 2016-03-09 DIAGNOSIS — Z833 Family history of diabetes mellitus: Secondary | ICD-10-CM

## 2016-03-09 DIAGNOSIS — E669 Obesity, unspecified: Secondary | ICD-10-CM | POA: Diagnosis present

## 2016-03-09 DIAGNOSIS — Z96612 Presence of left artificial shoulder joint: Secondary | ICD-10-CM | POA: Diagnosis present

## 2016-03-09 DIAGNOSIS — R410 Disorientation, unspecified: Secondary | ICD-10-CM

## 2016-03-09 DIAGNOSIS — Z91018 Allergy to other foods: Secondary | ICD-10-CM

## 2016-03-09 DIAGNOSIS — T40601A Poisoning by unspecified narcotics, accidental (unintentional), initial encounter: Secondary | ICD-10-CM | POA: Diagnosis present

## 2016-03-09 DIAGNOSIS — J9811 Atelectasis: Secondary | ICD-10-CM | POA: Diagnosis not present

## 2016-03-09 DIAGNOSIS — R4182 Altered mental status, unspecified: Secondary | ICD-10-CM | POA: Diagnosis present

## 2016-03-09 DIAGNOSIS — Z8249 Family history of ischemic heart disease and other diseases of the circulatory system: Secondary | ICD-10-CM

## 2016-03-09 DIAGNOSIS — J45909 Unspecified asthma, uncomplicated: Secondary | ICD-10-CM | POA: Diagnosis present

## 2016-03-09 LAB — URINALYSIS, ROUTINE W REFLEX MICROSCOPIC
BACTERIA UA: NONE SEEN
BILIRUBIN URINE: NEGATIVE
Glucose, UA: NEGATIVE mg/dL
Hgb urine dipstick: NEGATIVE
Ketones, ur: NEGATIVE mg/dL
LEUKOCYTES UA: NEGATIVE
Nitrite: NEGATIVE
Protein, ur: NEGATIVE mg/dL
SPECIFIC GRAVITY, URINE: 1.025 (ref 1.005–1.030)
pH: 5 (ref 5.0–8.0)

## 2016-03-09 LAB — CBC WITH DIFFERENTIAL/PLATELET
BASOS ABS: 0 10*3/uL (ref 0.0–0.1)
BASOS PCT: 0 %
Eosinophils Absolute: 0.2 10*3/uL (ref 0.0–0.7)
Eosinophils Relative: 1 %
HEMATOCRIT: 44.1 % (ref 39.0–52.0)
HEMOGLOBIN: 14.4 g/dL (ref 13.0–17.0)
LYMPHS PCT: 42 %
Lymphs Abs: 4.9 10*3/uL — ABNORMAL HIGH (ref 0.7–4.0)
MCH: 29.6 pg (ref 26.0–34.0)
MCHC: 32.7 g/dL (ref 30.0–36.0)
MCV: 90.6 fL (ref 78.0–100.0)
Monocytes Absolute: 0.7 10*3/uL (ref 0.1–1.0)
Monocytes Relative: 6 %
NEUTROS ABS: 6 10*3/uL (ref 1.7–7.7)
NEUTROS PCT: 51 %
Platelets: 349 10*3/uL (ref 150–400)
RBC: 4.87 MIL/uL (ref 4.22–5.81)
RDW: 15.1 % (ref 11.5–15.5)
WBC: 11.7 10*3/uL — ABNORMAL HIGH (ref 4.0–10.5)

## 2016-03-09 LAB — BASIC METABOLIC PANEL
ANION GAP: 8 (ref 5–15)
BUN: 15 mg/dL (ref 6–20)
CALCIUM: 9.2 mg/dL (ref 8.9–10.3)
CO2: 25 mmol/L (ref 22–32)
Chloride: 107 mmol/L (ref 101–111)
Creatinine, Ser: 1.25 mg/dL — ABNORMAL HIGH (ref 0.61–1.24)
Glucose, Bld: 99 mg/dL (ref 65–99)
POTASSIUM: 4.1 mmol/L (ref 3.5–5.1)
Sodium: 140 mmol/L (ref 135–145)

## 2016-03-09 LAB — I-STAT ARTERIAL BLOOD GAS, ED
Acid-base deficit: 1 mmol/L (ref 0.0–2.0)
Bicarbonate: 23.2 mmol/L (ref 20.0–28.0)
O2 Saturation: 96 %
PCO2 ART: 37.9 mmHg (ref 32.0–48.0)
PH ART: 7.395 (ref 7.350–7.450)
PO2 ART: 86 mmHg (ref 83.0–108.0)
Patient temperature: 98.6
TCO2: 24 mmol/L (ref 0–100)

## 2016-03-09 LAB — RAPID URINE DRUG SCREEN, HOSP PERFORMED
AMPHETAMINES: NOT DETECTED
Barbiturates: NOT DETECTED
Benzodiazepines: POSITIVE — AB
COCAINE: POSITIVE — AB
OPIATES: NOT DETECTED
TETRAHYDROCANNABINOL: POSITIVE — AB

## 2016-03-09 LAB — ETHANOL: Alcohol, Ethyl (B): <5 mg/dL (ref <5)

## 2016-03-09 MED ORDER — NALOXONE HCL 0.4 MG/ML IJ SOLN
0.4000 mg | Freq: Once | INTRAMUSCULAR | Status: AC
  Administered 2016-03-09: 0.4 mg via INTRAVENOUS

## 2016-03-09 MED ORDER — PROPOFOL 1000 MG/100ML IV EMUL
INTRAVENOUS | Status: AC
  Filled 2016-03-09: qty 100

## 2016-03-09 MED ORDER — LORAZEPAM 2 MG/ML IJ SOLN
1.0000 mg | Freq: Once | INTRAMUSCULAR | Status: AC
  Administered 2016-03-09: 1 mg via INTRAVENOUS

## 2016-03-09 MED ORDER — ETOMIDATE 2 MG/ML IV SOLN
30.0000 mg | Freq: Once | INTRAVENOUS | Status: AC
  Administered 2016-03-09: 30 mg via INTRAVENOUS

## 2016-03-09 MED ORDER — NALOXONE HCL 0.4 MG/ML IJ SOLN
INTRAMUSCULAR | Status: AC
  Filled 2016-03-09: qty 1

## 2016-03-09 MED ORDER — STERILE WATER FOR INJECTION IJ SOLN
INTRAMUSCULAR | Status: AC
  Filled 2016-03-09: qty 10

## 2016-03-09 MED ORDER — ZIPRASIDONE MESYLATE 20 MG IM SOLR
20.0000 mg | Freq: Once | INTRAMUSCULAR | Status: AC
  Administered 2016-03-09: 20 mg via INTRAMUSCULAR
  Filled 2016-03-09: qty 20

## 2016-03-09 MED ORDER — ETOMIDATE 2 MG/ML IV SOLN
INTRAVENOUS | Status: AC | PRN
  Administered 2016-03-09: 30 mg via INTRAVENOUS

## 2016-03-09 MED ORDER — SODIUM CHLORIDE 0.9 % IV SOLN
3.0000 g | Freq: Once | INTRAVENOUS | Status: AC
  Administered 2016-03-09: 3 g via INTRAVENOUS
  Filled 2016-03-09: qty 3

## 2016-03-09 MED ORDER — SUCCINYLCHOLINE CHLORIDE 20 MG/ML IJ SOLN
150.0000 mg | Freq: Once | INTRAMUSCULAR | Status: AC
  Administered 2016-03-09: 150 mg via INTRAVENOUS

## 2016-03-09 MED ORDER — LORAZEPAM 1 MG PO TABS: 1.0000 mg | ORAL_TABLET | Freq: Once | ORAL | Status: DC

## 2016-03-09 MED ORDER — SUCCINYLCHOLINE CHLORIDE 20 MG/ML IJ SOLN
INTRAMUSCULAR | Status: AC | PRN
  Administered 2016-03-09: 150 mg via INTRAVENOUS

## 2016-03-09 MED ORDER — HYDROCODONE-ACETAMINOPHEN 10-325 MG PO TABS: 1.0000 | ORAL_TABLET | Freq: Four times a day (QID) | ORAL | 0 refills | Status: DC | PRN

## 2016-03-09 MED ORDER — SODIUM CHLORIDE 0.9 % IV BOLUS (SEPSIS)
1000.0000 mL | Freq: Once | INTRAVENOUS | Status: AC
  Administered 2016-03-09: 1000 mL via INTRAVENOUS

## 2016-03-09 MED ORDER — IOPAMIDOL (ISOVUE-300) INJECTION 61%
INTRAVENOUS | Status: AC
  Administered 2016-03-09: 100 mL
  Filled 2016-03-09: qty 100

## 2016-03-09 MED ORDER — LORAZEPAM 2 MG/ML IJ SOLN
INTRAMUSCULAR | Status: AC
  Administered 2016-03-09: 1 mg via INTRAVENOUS
  Filled 2016-03-09: qty 1

## 2016-03-09 MED ORDER — PROPOFOL 1000 MG/100ML IV EMUL
5.0000 ug/kg/min | Freq: Once | INTRAVENOUS | Status: AC
  Administered 2016-03-09: 40 ug/kg/min via INTRAVENOUS
  Administered 2016-03-09: 10 ug/kg/min via INTRAVENOUS

## 2016-03-09 MED ORDER — FENTANYL CITRATE (PF) 100 MCG/2ML IJ SOLN
150.0000 ug | Freq: Once | INTRAMUSCULAR | Status: AC
  Administered 2016-03-09: 150 ug via INTRAVENOUS
  Filled 2016-03-09: qty 4

## 2016-03-09 MED ORDER — LORAZEPAM 2 MG/ML IJ SOLN
INTRAMUSCULAR | Status: AC
  Filled 2016-03-09: qty 1

## 2016-03-09 NOTE — ED Notes (Signed)
Successful intubation by dr Criss Alvinegoldston, verified via chest xray
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
## 2016-03-09 NOTE — Telephone Encounter (Signed)
I sent pt a mychart message.

## 2016-03-09 NOTE — ED Notes (Addendum)
Checked rectal temp 95.8, rechecked 96. RN Shanda BumpsJessica informed.
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
## 2016-03-09 NOTE — ED Notes (Signed)
Pt restless/agitated. Pt educated that he needed to be still for further imaging; pt compliant and verbalizes understanding in admin of recieving geodan IM.

## 2016-03-09 NOTE — Telephone Encounter (Signed)
done
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
## 2016-03-09 NOTE — Progress Notes (Signed)
Patient intubated by ED physician with a 8.0 ETT taped at 25 cm at lip, good color change on ETCO2 detector, SATS 95%.

## 2016-03-09 NOTE — ED Notes (Signed)
Mother would like to be contacted with any update Patrick SievingMillye Gailey [phone]
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
## 2016-03-09 NOTE — ED Notes (Signed)
Patient reports he takes norco 4x daily as well as tramadol for previous left shoulder injury.

## 2016-03-09 NOTE — Progress Notes (Signed)
Transported pt to and from ED D32 to CT3 on ventilator. Pt stable throughout with no complications. RT will continue to monitor.
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
## 2016-03-09 NOTE — ED Notes (Signed)
After narcan, patient much more awake. Very antsy in stretcher, climbing over side rails, very unsteady on his feet when he's standing. Pt demanding to use restroom, advised patient that he is too unsteady to walk that a urinal would be safer, patient refusing urinal, climbing out of bed, ripping cords off. Agreed to wheel patient to restroom. Once in restroom patient was able to provide urine sample. Once brought back to room, patient family standing at the door. Pt stated he had to use the restroom and was unable to with staff standing by for assistance if needed. Patient again refusing to lie in the stretcher until taken to the restroom without assistance. Pt refusing staff assistance at this time. Pt made aware of risk of falling, patient states "I hope I fall so you can sedate me."

## 2016-03-09 NOTE — ED Notes (Signed)
Pt is bucking tube despite a 40 bolus of propofol, edp notified

## 2016-03-10 ENCOUNTER — Inpatient Hospital Stay (HOSPITAL_COMMUNITY): Payer: BLUE CROSS/BLUE SHIELD

## 2016-03-10 ENCOUNTER — Encounter (HOSPITAL_COMMUNITY): Payer: Self-pay | Admitting: *Deleted

## 2016-03-10 DIAGNOSIS — J9811 Atelectasis: Secondary | ICD-10-CM

## 2016-03-10 DIAGNOSIS — J9601 Acute respiratory failure with hypoxia: Secondary | ICD-10-CM

## 2016-03-10 LAB — BASIC METABOLIC PANEL
Anion gap: 7 (ref 5–15)
BUN: 12 mg/dL (ref 6–20)
CO2: 24 mmol/L (ref 22–32)
CREATININE: 1.04 mg/dL (ref 0.61–1.24)
Calcium: 8.7 mg/dL — ABNORMAL LOW (ref 8.9–10.3)
Chloride: 108 mmol/L (ref 101–111)
GFR calc Af Amer: >60 mL/min (ref >60)
GLUCOSE: 103 mg/dL — AB (ref 65–99)
Potassium: 3.9 mmol/L (ref 3.5–5.1)
SODIUM: 139 mmol/L (ref 135–145)

## 2016-03-10 LAB — CBC
HCT: 44.6 % (ref 39.0–52.0)
Hemoglobin: 14.7 g/dL (ref 13.0–17.0)
MCH: 29.2 pg (ref 26.0–34.0)
MCHC: 33 g/dL (ref 30.0–36.0)
MCV: 88.7 fL (ref 78.0–100.0)
PLATELETS: 339 10*3/uL (ref 150–400)
RBC: 5.03 MIL/uL (ref 4.22–5.81)
RDW: 14.8 % (ref 11.5–15.5)
WBC: 13 10*3/uL — ABNORMAL HIGH (ref 4.0–10.5)

## 2016-03-10 LAB — GLUCOSE, CAPILLARY
GLUCOSE-CAPILLARY: 114 mg/dL — AB (ref 65–99)
Glucose-Capillary: 103 mg/dL — ABNORMAL HIGH (ref 65–99)
Glucose-Capillary: 131 mg/dL — ABNORMAL HIGH (ref 65–99)
Glucose-Capillary: 95 mg/dL (ref 65–99)

## 2016-03-10 LAB — MRSA PCR SCREENING: MRSA by PCR: NEGATIVE

## 2016-03-10 MED ORDER — HYDROCODONE-ACETAMINOPHEN 7.5-325 MG PO TABS
1.0000 | ORAL_TABLET | Freq: Four times a day (QID) | ORAL | Status: DC | PRN
  Administered 2016-03-10 – 2016-03-11 (×3): 1 via ORAL
  Filled 2016-03-10 (×3): qty 1

## 2016-03-10 MED ORDER — SODIUM CHLORIDE 0.9 % IV SOLN
3.0000 g | Freq: Four times a day (QID) | INTRAVENOUS | Status: DC
  Administered 2016-03-10 – 2016-03-11 (×6): 3 g via INTRAVENOUS
  Filled 2016-03-10 (×8): qty 3

## 2016-03-10 MED ORDER — HEPARIN SODIUM (PORCINE) 5000 UNIT/ML IJ SOLN
5000.0000 [IU] | Freq: Three times a day (TID) | INTRAMUSCULAR | Status: DC
  Administered 2016-03-10 – 2016-03-11 (×4): 5000 [IU] via SUBCUTANEOUS
  Filled 2016-03-10 (×4): qty 1

## 2016-03-10 MED ORDER — PROPOFOL 1000 MG/100ML IV EMUL
INTRAVENOUS | Status: AC
  Filled 2016-03-10: qty 100

## 2016-03-10 MED ORDER — ORAL CARE MOUTH RINSE
15.0000 mL | Freq: Four times a day (QID) | OROMUCOSAL | Status: DC
  Administered 2016-03-10: 15 mL via OROMUCOSAL

## 2016-03-10 MED ORDER — SODIUM CHLORIDE 0.9 % IV SOLN
25.0000 ug/h | INTRAVENOUS | Status: DC
  Administered 2016-03-10: 100 ug/h via INTRAVENOUS
  Filled 2016-03-10: qty 50

## 2016-03-10 MED ORDER — ORAL CARE MOUTH RINSE
15.0000 mL | OROMUCOSAL | Status: DC | PRN
  Administered 2016-03-10: 15 mL via OROMUCOSAL
  Filled 2016-03-10: qty 15

## 2016-03-10 MED ORDER — TRAMADOL HCL 50 MG PO TABS
50.0000 mg | ORAL_TABLET | Freq: Four times a day (QID) | ORAL | Status: DC | PRN
  Administered 2016-03-10 (×2): 50 mg via ORAL
  Filled 2016-03-10 (×2): qty 1

## 2016-03-10 MED ORDER — CLONAZEPAM 1 MG PO TABS
1.0000 mg | ORAL_TABLET | Freq: Two times a day (BID) | ORAL | Status: DC | PRN
  Administered 2016-03-10: 1 mg via ORAL
  Filled 2016-03-10: qty 1

## 2016-03-10 MED ORDER — SODIUM CHLORIDE 0.9 % IV SOLN: 250.0000 mL | INTRAVENOUS | Status: DC | PRN

## 2016-03-10 MED ORDER — CLONAZEPAM 1 MG PO TABS
1.0000 mg | ORAL_TABLET | Freq: Two times a day (BID) | ORAL | Status: DC
  Administered 2016-03-10: 1 mg
  Filled 2016-03-10 (×2): qty 1

## 2016-03-10 MED ORDER — PANTOPRAZOLE SODIUM 40 MG PO PACK
40.0000 mg | PACK | Freq: Every day | ORAL | Status: DC
  Filled 2016-03-10: qty 20

## 2016-03-10 MED ORDER — PROPOFOL 1000 MG/100ML IV EMUL
5.0000 ug/kg/min | INTRAVENOUS | Status: DC
  Administered 2016-03-10: 60 ug/kg/min via INTRAVENOUS
  Administered 2016-03-10: 30 ug/kg/min via INTRAVENOUS
  Filled 2016-03-10: qty 100

## 2016-03-10 MED ORDER — PROPOFOL 1000 MG/100ML IV EMUL: 5.0000 ug/kg/min | Freq: Once | INTRAVENOUS | Status: DC

## 2016-03-10 MED ORDER — FENTANYL CITRATE (PF) 100 MCG/2ML IJ SOLN: 12.5000 ug | INTRAMUSCULAR | Status: DC | PRN

## 2016-03-10 MED ORDER — MIDAZOLAM HCL 2 MG/2ML IJ SOLN: 1.0000 mg | INTRAMUSCULAR | Status: DC | PRN

## 2016-03-10 MED ORDER — ALBUTEROL SULFATE (2.5 MG/3ML) 0.083% IN NEBU
2.5000 mg | INHALATION_SOLUTION | RESPIRATORY_TRACT | Status: DC | PRN
  Administered 2016-03-10: 2.5 mg via RESPIRATORY_TRACT
  Filled 2016-03-10: qty 3

## 2016-03-10 MED ORDER — ENSURE ENLIVE PO LIQD
237.0000 mL | Freq: Two times a day (BID) | ORAL | Status: DC
  Administered 2016-03-10 – 2016-03-11 (×2): 237 mL via ORAL

## 2016-03-10 MED ORDER — FENTANYL BOLUS VIA INFUSION
25.0000 ug | INTRAVENOUS | Status: DC | PRN
  Administered 2016-03-10 (×2): 50 ug via INTRAVENOUS
  Filled 2016-03-10: qty 50

## 2016-03-10 MED ORDER — FLUOXETINE HCL 20 MG PO CAPS
40.0000 mg | ORAL_CAPSULE | Freq: Every day | ORAL | Status: DC
  Filled 2016-03-10 (×2): qty 2

## 2016-03-10 MED ORDER — PANTOPRAZOLE SODIUM 40 MG PO TBEC
40.0000 mg | DELAYED_RELEASE_TABLET | Freq: Every day | ORAL | Status: DC
  Administered 2016-03-10: 40 mg via ORAL
  Filled 2016-03-10 (×2): qty 1

## 2016-03-10 MED ORDER — TEMAZEPAM 15 MG PO CAPS
30.0000 mg | ORAL_CAPSULE | Freq: Every day | ORAL | Status: DC
  Administered 2016-03-10: 30 mg via ORAL
  Filled 2016-03-10: qty 2

## 2016-03-10 MED ORDER — CHLORHEXIDINE GLUCONATE 0.12% ORAL RINSE (MEDLINE KIT)
15.0000 mL | Freq: Two times a day (BID) | OROMUCOSAL | Status: DC
  Administered 2016-03-10 – 2016-03-11 (×2): 15 mL via OROMUCOSAL
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
## 2016-03-10 NOTE — Progress Notes (Signed)
Placed patient on CPAP for the night with pressure set at 10cm.   

## 2016-03-10 NOTE — Progress Notes (Signed)
PT HAS ORDERS FOR CARDIAC MONITORING FROM ICU . PT IS NOT ON CARDIAC MONITOR . CALLED OVER TO [facility] AND SPOKE WITH  Gretchen RN  Who stated the md is aware and he is currently cleaning up orders to d/c tele .

## 2016-03-10 NOTE — Progress Notes (Signed)
PT HAS ORDERS FOR CARDIAC MONITORING FROM ICU . PT IS NOT ON CARDIAC MONITOR . CALLED OVER TO [facility] AND SPOKE WITH  Gretchen RN  Who stated the mdCelene Skeen( Nester) is aware and he is currently cleaning up orders to d/c tele .

## 2016-03-10 NOTE — Procedures (Signed)
Extubation Procedure Note  Patient Details:   Name: Helmut Musterravis Brian Pais DOB: 06/01/1970 MRN: [id number]   Airway Documentation:     Evaluation  O2 sats: stable throughout Complications: No apparent complications Patient did tolerate procedure well. Bilateral Breath Sounds: Rhonchi   Yes  Ave Filterdkins, Kyshaun Barnette Williams 03/10/2016, 9:19 AM
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
## 2016-03-10 NOTE — Progress Notes (Signed)
eLink Physician-Brief Progress Note Patient Name: Helmut Musterravis Brian Milos DOB: 1970/05/20 MRN: [id number]   Date of Service  03/10/2016  HPI/Events of Note  Patient agitated and attempting to pull out lines despite continuous infusion of propofol.   eICU Interventions  1. Continuing propofol infusion. 2. Starting fentanyl infusion 3. Adding Versed IV as necessary for sedation goal  4. Ordering bilateral soft wrist restraints     Intervention Category Major Interventions: Acid-Base disturbance - evaluation and management;Delirium, psychosis, severe agitation - evaluation and management  Lawanda CousinsJennings Norah Devin 03/10/2016, 3:24 AM

## 2016-03-10 NOTE — Progress Notes (Signed)
Wasted 1500 mcg of IV fentanyl. Witnessed by Avon ProductsHolland,Summar RN. Danne HarborJohny,RN

## 2016-03-10 NOTE — Progress Notes (Signed)
eLink Physician-Brief Progress Note Patient Name: Patrick Logan DOB: Jul 29, 1970 MRN: [id number]   Date of Service  03/10/2016  HPI/Events of Note  Should transferred to medical floor bed without telemetry monitoring available. Also transferred with sedation and analgesia on his medicine reconciliation.   eICU Interventions  1. Discontinuing cardiac monitoring 2. Discontinuing fentanyl & propofol      Intervention Category Minor Interventions: Routine modifications to care plan (e.g. PRN medications for pain, fever)  Lawanda CousinsJennings Alitza Cowman 03/10/2016, 11:29 PM

## 2016-03-10 NOTE — Progress Notes (Signed)
Pharmacy Antibiotic Note  Patrick Logan Brian Seawright is a 46 y.o. male admitted on 03/09/2016 with aspiration pneumonia.  Pharmacy has been consulted for Unasyn dosing.  Plan: Unasyn 3g IV Q6H.  Height: 6' (182.9 cm) Weight: 253 lb 8.5 oz (115 kg) IBW/kg (Calculated) : 77.6  Temp (24hrs), Avg:96.9 F (36.1 C), Min:96 F (35.6 C), Max:97.7 F (36.5 C)   Recent Labs Lab 03/09/16 1319  WBC 11.7*  CREATININE 1.25*    Estimated Creatinine Clearance: 97.7 mL/min (by C-G formula based on SCr of 1.25 mg/dL (H)).    Allergies  Allergen Reactions  . Tomato Shortness Of Breath  . Aspirin     REACTION: bronchospasm  . Nsaids     REACTION: bronchospasm    Thank you for allowing pharmacy to be a part of this patient's care.  Vernard GamblesVeronda Weda Baumgarner, PharmD, BCPS  03/10/2016 1:24 AM

## 2016-03-11 LAB — BASIC METABOLIC PANEL
ANION GAP: 9 (ref 5–15)
BUN: 12 mg/dL (ref 6–20)
CHLORIDE: 107 mmol/L (ref 101–111)
CO2: 24 mmol/L (ref 22–32)
CREATININE: 0.94 mg/dL (ref 0.61–1.24)
Calcium: 8.7 mg/dL — ABNORMAL LOW (ref 8.9–10.3)
GFR calc non Af Amer: >60 mL/min (ref >60)
Glucose, Bld: 116 mg/dL — ABNORMAL HIGH (ref 65–99)
POTASSIUM: 3.7 mmol/L (ref 3.5–5.1)
SODIUM: 140 mmol/L (ref 135–145)

## 2016-03-11 LAB — CBC
HEMATOCRIT: 43.1 % (ref 39.0–52.0)
HEMOGLOBIN: 14.2 g/dL (ref 13.0–17.0)
MCH: 29.2 pg (ref 26.0–34.0)
MCHC: 32.9 g/dL (ref 30.0–36.0)
MCV: 88.7 fL (ref 78.0–100.0)
PLATELETS: 295 10*3/uL (ref 150–400)
RBC: 4.86 MIL/uL (ref 4.22–5.81)
RDW: 14.7 % (ref 11.5–15.5)
WBC: 9.5 10*3/uL (ref 4.0–10.5)

## 2016-03-11 LAB — GLUCOSE, CAPILLARY
GLUCOSE-CAPILLARY: 103 mg/dL — AB (ref 65–99)
GLUCOSE-CAPILLARY: 141 mg/dL — AB (ref 65–99)
Glucose-Capillary: 124 mg/dL — ABNORMAL HIGH (ref 65–99)

## 2016-03-11 MED ORDER — AMOXICILLIN-POT CLAVULANATE 875-125 MG PO TABS: 1.0000 | ORAL_TABLET | Freq: Two times a day (BID) | ORAL | 0 refills | Status: DC

## 2016-03-11 MED ORDER — AMOXICILLIN-POT CLAVULANATE 875-125 MG PO TABS: 1.0000 | ORAL_TABLET | Freq: Two times a day (BID) | ORAL | Status: DC
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
## 2016-03-11 NOTE — Progress Notes (Signed)
Discharge instructions and prescription provided to patient.  IV x 2 removed.  No questions at time of discharge.

## 2016-03-12 ENCOUNTER — Encounter: Payer: Self-pay | Admitting: Family Medicine

## 2016-03-12 LAB — HEMOGLOBIN A1C
HEMOGLOBIN A1C: 6 % — AB (ref 4.8–5.6)
Mean Plasma Glucose: 126 mg/dL

## 2016-03-12 NOTE — Telephone Encounter (Signed)
This patient was recently in the hospital and they found that he had been obtaining narcotic medications from multiple sources, in addition it using cocaine and THC. I wish to discharge him from my practice and from all of [facility]. Please draft a letter to this effect ASAP.

## 2016-03-13 ENCOUNTER — Telehealth: Payer: Self-pay

## 2016-03-13 NOTE — Telephone Encounter (Signed)
LMTCB

## 2016-03-15 LAB — CULTURE, BLOOD (ROUTINE X 2)
CULTURE: NO GROWTH
Culture: NO GROWTH

## 2016-03-19 NOTE — Telephone Encounter (Signed)
LMTCB

## 2016-03-20 NOTE — Telephone Encounter (Signed)
LMTCB

## 2016-03-22 ENCOUNTER — Ambulatory Visit (INDEPENDENT_AMBULATORY_CARE_PROVIDER_SITE_OTHER): Payer: Self-pay | Admitting: Orthopaedic Surgery

## 2016-03-23 NOTE — Telephone Encounter (Signed)
ATC x3 with no reply. Closing message per protocol.  

## 2016-03-26 ENCOUNTER — Ambulatory Visit (INDEPENDENT_AMBULATORY_CARE_PROVIDER_SITE_OTHER): Payer: Self-pay | Admitting: Orthopedic Surgery

## 2016-04-02 ENCOUNTER — Ambulatory Visit (INDEPENDENT_AMBULATORY_CARE_PROVIDER_SITE_OTHER): Payer: Self-pay | Admitting: Orthopaedic Surgery

## 2016-04-04 ENCOUNTER — Ambulatory Visit (INDEPENDENT_AMBULATORY_CARE_PROVIDER_SITE_OTHER): Payer: Self-pay | Admitting: Orthopedic Surgery

## 2016-04-19 ENCOUNTER — Ambulatory Visit (INDEPENDENT_AMBULATORY_CARE_PROVIDER_SITE_OTHER): Payer: Self-pay

## 2016-04-19 ENCOUNTER — Ambulatory Visit (INDEPENDENT_AMBULATORY_CARE_PROVIDER_SITE_OTHER): Payer: BLUE CROSS/BLUE SHIELD | Admitting: Orthopedic Surgery

## 2016-04-19 ENCOUNTER — Encounter (INDEPENDENT_AMBULATORY_CARE_PROVIDER_SITE_OTHER): Payer: Self-pay | Admitting: Orthopedic Surgery

## 2016-04-19 DIAGNOSIS — M25512 Pain in left shoulder: Secondary | ICD-10-CM

## 2016-04-19 DIAGNOSIS — G8929 Other chronic pain: Secondary | ICD-10-CM

## 2016-04-22 NOTE — Progress Notes (Signed)
Office Visit Note   Patient: Patrick Logan Brian Alanis           Date of Birth: [birth year]           MRN: [id number] Visit Date: 04/19/2016 Requested by: Nelwyn SalisburyStephen A Fry, MD [address] [place], [place] [id number] PCP: Gershon CraneStephen Fry, MD  Subjective: Chief Complaint  Patient presents with  . Left Shoulder - Pain    HPI Patrick Logan is a 46 year old patient with left shoulder pain.  His last surgery was 3 years ago which was a humeral hemiarthroplasty for avascular necrosis.  He describes pain as well as decreased range of motion.  He works in Conservation [employer], [employer] doing kitchen and bath work.  He can only work about 3-4 hours a day 3 times a week on average.  Describes decreased strength.  Patient is allergic to nonsteroidals.  He denies any fevers or chills related to this surgery.  He does report being limited as far as work is concerned.  He also describes pain is his primary issue.              Review of Systems All systems reviewed are negative as they relate to the chief complaint within the history of present illness.  Patient denies  fevers or chills.   Assessment & Plan: Visit Diagnoses:  1. Chronic left shoulder pain     Plan: Impression is left shoulder pain and some weakness in the patient who has a partial shoulder replacement at a very young age.  I think is likely getting some glenoid wear.  Plan is for CT arthrogram for preop planning as well as evaluation of the rotator cuff.  This is a very difficult situation for [place].  I discussed it with him today extensively the roles of shoulder replacement in young people.  Nonetheless I think he may potentially require that if his rotator cuff is intact.  I'll see him back after his CT arthrogram  Follow-Up Instructions: No Follow-up on file.   Orders:  Orders Placed This Encounter  Procedures  . XR Shoulder Left  . CT SHOULDER LEFT WO CONTRAST   No orders of the defined types were placed in this encounter.     Procedures: No procedures  performed   Clinical Data: No additional findings.  Objective: Vital Signs: There were no vitals taken for this visit.  Physical Exam   Constitutional: Patient appears well-developed HEENT:  Head: Normocephalic Eyes:EOM are normal Neck: Normal range of motion Cardiovascular: Normal rate Pulmonary/chest: Effort normal Neurologic: Patient is alert Skin: Skin is warm Psychiatric: Patient has normal mood and affect    Ortho Exam examination the left shoulder demonstrates fairly reasonable range of motion on the left-hand side with functional deltoid strength.  Does not have much forward flexion and abduction beyond 90.  Does have a little weakness on testing left versus right but that may be from pain.  Motor sensory function to the hand is intact.  Cervical spine range of motion is full.  Well-healed surgical incision present on the anterior aspect of the shoulder.  Specialty Comments:  No specialty comments available.  Imaging: No results found.   PMFS History: Patient Active Problem List   Diagnosis Date Noted  . Acute respiratory failure (HCC) 03/09/2016  . Environmental allergies 06/16/2015  . 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 CHEST WALL PAIN, ACUTE 04/18/2010  . SHOULDER PAIN, LEFT 04/06/2010  . HYPERGLYCEMIA 01/05/2010  . URI 01/03/2010  . Essential hypertension 01/03/2010  . PANIC DISORDER  11/29/2009  . Depression with anxiety 11/29/2009  . INSOMNIA 11/29/2009  . BACK STRAIN, ACUTE 11/29/2009  . COMMON MIGRAINE 01/07/2007  . GERD 01/03/2007  . THROAT PAIN 01/03/2007  . TACHYCARDIA 12/19/2006  . LUMBAR STRAIN 12/19/2006   Past Medical History:  Diagnosis Date  . Allergy   . Anxiety   . Arthritis   . Asthma   . Chronic left shoulder pain   . Depression   . GERD (gastroesophageal reflux disease)   . Ulcer (HCC)     Family History  Problem Relation Age of Onset  . Hypertension Mother   . Diabetes Father 59  . Autism Son   . Aneurysm  Maternal Grandmother   . Alcohol abuse Maternal Grandfather   . Cirrhosis Maternal Grandfather     Past Surgical History:  Procedure Laterality Date  . COLONOSCOPY  2012   per Dr. Elnoria Howard, clear   . ESOPHAGOGASTRODUODENOSCOPY  2012   per Dr. Elnoria Howard, normal   . KNEE SURGERY     R 2004  and L 2008  . SHOULDER SURGERY  2008   both   . TOTAL SHOULDER ARTHROPLASTY  03/21/2012   Procedure: TOTAL SHOULDER ARTHROPLASTY;  Surgeon: Verlee Rossetti, MD;  Location: [facility] OR;  Service: Orthopedics;  Laterality: Left;  Left Shoudler Hemi Arthroplasty  . TOTAL SHOULDER REPLACEMENT  03/21/2012   LEFT SHOULDER   Social History   Occupational History  . Museum/gallery exhibitions officer Unemployed   Social History Main Topics  . Smoking status: Former Smoker    Packs/day: 0.25    Years: 25.00    Types: Cigarettes  . Smokeless tobacco: Never Used     Comment: 18 months ago  . Alcohol use No  . Drug use: No  . Sexual activity: Not Currently

## 2016-05-07 ENCOUNTER — Other Ambulatory Visit: Payer: Self-pay | Admitting: Family Medicine

## 2016-05-09 ENCOUNTER — Ambulatory Visit
Admission: RE | Admit: 2016-05-09 | Discharge: 2016-05-09 | Disposition: A | Discharge status: Home / Self Care | Payer: BLUE CROSS/BLUE SHIELD | Source: Ambulatory Visit | Attending: Orthopedic Surgery | Admitting: Orthopedic Surgery

## 2016-05-09 DIAGNOSIS — G8929 Other chronic pain: Secondary | ICD-10-CM

## 2016-05-09 DIAGNOSIS — M25512 Pain in left shoulder: Principal | ICD-10-CM

## 2016-05-16 ENCOUNTER — Encounter (INDEPENDENT_AMBULATORY_CARE_PROVIDER_SITE_OTHER): Payer: Self-pay | Admitting: Orthopedic Surgery

## 2016-05-16 ENCOUNTER — Ambulatory Visit (INDEPENDENT_AMBULATORY_CARE_PROVIDER_SITE_OTHER): Payer: BLUE CROSS/BLUE SHIELD | Admitting: Orthopedic Surgery

## 2016-05-16 DIAGNOSIS — M19012 Primary osteoarthritis, left shoulder: Secondary | ICD-10-CM

## 2016-05-16 LAB — CBC WITH DIFFERENTIAL/PLATELET
BASOS ABS: 0 {cells}/uL (ref 0–200)
Basophils Relative: 0 %
EOS PCT: 2 %
Eosinophils Absolute: 226 cells/uL (ref 15–500)
HCT: 48.9 % (ref 38.5–50.0)
Hemoglobin: 16.3 g/dL (ref 13.2–17.1)
LYMPHS PCT: 33 %
Lymphs Abs: 3729 cells/uL (ref 850–3900)
MCH: 29.5 pg (ref 27.0–33.0)
MCHC: 33.3 g/dL (ref 32.0–36.0)
MCV: 88.6 fL (ref 80.0–100.0)
MONOS PCT: 6 %
MPV: 9.5 fL (ref 7.5–12.5)
Monocytes Absolute: 678 cells/uL (ref 200–950)
NEUTROS PCT: 59 %
Neutro Abs: 6667 cells/uL (ref 1500–7800)
Platelets: 354 10*3/uL (ref 140–400)
RBC: 5.52 MIL/uL (ref 4.20–5.80)
RDW: 14.3 % (ref 11.0–15.0)
WBC: 11.3 10*3/uL — ABNORMAL HIGH (ref 3.8–10.8)

## 2016-05-16 MED ORDER — TRAMADOL HCL 50 MG PO TABS: 50.0000 mg | ORAL_TABLET | Freq: Four times a day (QID) | ORAL | 0 refills | Status: DC | PRN

## 2016-05-16 NOTE — Progress Notes (Signed)
Office Visit Note   Patient: Patrick Logan           Date of Birth: 03/26/1970           MRN: [id number] Visit Date: 05/16/2016 Requested by: Nelwyn SalisburyStephen A Fry, MD [address], [place] [id number] PCP: Gershon CraneStephen Fry, MD  Subjective: Chief Complaint  Patient presents with  . Left Shoulder - Follow-up     HPI: Patrick Logan is a 46 year old patient with left shoulder pain.  Here to review CT scan.  No real change in symptoms he has continuous daily pain.  Had hemiarthroplasty 3 years ago.  His had pain since that time.  He has constant pain which never stops.  Denies any fevers or chills.  States that he can't really work anymore.  He cannot take anti-inflammatories.  He is taking Tylenol and some type of off brand pain medication called  CRE BUN.  Lives with his mother and father at home.  No dental issues.             ROS:All systems reviewed are negative as they relate to the chief complaint within the history of present illness.  Patient denies  fevers or chills.    Assessment & Plan: Visit Diagnoses:  1. Primary osteoarthritis of left shoulder     Plan:  impression is left shoulder pain.  Concern this time is that this may represent occult infection.  Want to draw CBC differential sedimentation rate C-reactive protein.  CT scan does show a little bit of central erosion in the glenoid and that there is likely the source of pain.  I'll call him with the results of the lab work and then potentially plan for surgical intervention after that.  This is not a great option for Patrick Logan at age 46 to undergo revision shoulder surgery.  I think the chance of having nerve damage and dislocation is reasonably high.  I think the prosthesis he has in place is one that is not a platform system and would be difficult to extract.  That makes future conversion to reverse shoulder replacement even more difficult.  I'll call him with the results of his lab work. llow-Up Instructions: No Follow-up on  file.   Orders:  No orders of the defined types were placed in this encounter.  No orders of the defined types were placed in this encounter.     Procedures: No procedures performed   Clinical Data: No additional findings.  Objective: Vital Signs: There were no vitals taken for this visit.  Physical Ex   Constitutional: Patient appears well-developed HEENT:  Head: Normocephalic Eyes:EOM are normal Neck: Normal range of motion Cardiovascular: Normal rate Pulmonary/chest: Effort normal Neurologic: Patient is alert Skin: Skin is warm Psychiatric: Patient has normal mood and affect  Physisrtho Exam:  orthopedic exam demonstrates painful range of motion of the left shoulder but there is no warmth.  Has pain with range of motion beyond 30 of external rotation and beyond about 75-80 of forward flexion and abduction.  No course grinding or crepitus in the shoulder girdle region.  CT scan is reviewed and it does show some central erosion but otherwise well-seated prosthesis and no rotator cuff tear Specialty Comments:  No specialty comments available.  Imaging: No results found.   PMFS History: Patient Active Problem List   Diagnosis Date Noted  . Acute respiratory failure (HCC) 03/09/2016  . Environmental allergies 06/16/2015  . 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 CHEST WALL PAIN, ACUTE  04/18/2010  . SHOULDER PAIN, LEFT 04/06/2010  . HYPERGLYCEMIA 01/05/2010  . URI 01/03/2010  . Essential hypertension 01/03/2010  . PANIC DISORDER 11/29/2009  . Depression with anxiety 11/29/2009  . INSOMNIA 11/29/2009  . BACK STRAIN, ACUTE 11/29/2009  . COMMON MIGRAINE 01/07/2007  . GERD 01/03/2007  . THROAT PAIN 01/03/2007  . TACHYCARDIA 12/19/2006  . LUMBAR STRAIN 12/19/2006   Past Medical History:  Diagnosis Date  . Allergy   . Anxiety   . Arthritis   . Asthma   . Chronic left shoulder pain   . Depression   . GERD (gastroesophageal reflux disease)   . Ulcer  (HCC)     Family History  Problem Relation Age of Onset  . Hypertension Mother   . Diabetes Father 70  . Autism Son   . Aneurysm Maternal Grandmother   . Alcohol abuse Maternal Grandfather   . Cirrhosis Maternal Grandfather     Past Surgical History:  Procedure Laterality Date  . COLONOSCOPY  2012   per Dr. Elnoria Howard, clear   . ESOPHAGOGASTRODUODENOSCOPY  2012   per Dr. Elnoria Howard, normal   . KNEE SURGERY     R 2004  and L 2008  . SHOULDER SURGERY  2008   both   . TOTAL SHOULDER ARTHROPLASTY  03/21/2012   Procedure: TOTAL SHOULDER ARTHROPLASTY;  Surgeon: Verlee Rossetti, MD;  Location[facility];  Service: Orthopedics;  Laterality: Left;  Left Shoudler Hemi Arthroplasty  . TOTAL SHOULDER REPLACEMENT  03/21/2012   LEFT SHOULDER   Social History   Occupational History  . Museum/gallery exhibitions officer Unemployed   Social History Main Topics  . Smoking status: Former Smoker    Packs/day: 0.25    Years: 25.00    Types: Cigarettes  . Smokeless tobacco: Never Used     Comment: 18 months ago  . Alcohol use No  . Drug use: No  . Sexual activity: Not Currently

## 2016-05-17 LAB — SEDIMENTATION RATE: Sed Rate: 5 mm/hr (ref 0–15)

## 2016-05-17 LAB — C-REACTIVE PROTEIN: CRP: 4.1 mg/L (ref <8.0)

## 2016-05-24 ENCOUNTER — Other Ambulatory Visit (INDEPENDENT_AMBULATORY_CARE_PROVIDER_SITE_OTHER): Payer: Self-pay | Admitting: Orthopedic Surgery

## 2016-05-24 MED ORDER — TRAMADOL HCL 50 MG PO TABS: 50.0000 mg | ORAL_TABLET | Freq: Four times a day (QID) | ORAL | 0 refills | Status: DC | PRN

## 2016-06-04 ENCOUNTER — Other Ambulatory Visit (INDEPENDENT_AMBULATORY_CARE_PROVIDER_SITE_OTHER): Payer: Self-pay | Admitting: Orthopedic Surgery

## 2016-06-05 ENCOUNTER — Other Ambulatory Visit (INDEPENDENT_AMBULATORY_CARE_PROVIDER_SITE_OTHER): Payer: Self-pay | Admitting: Orthopedic Surgery

## 2016-06-05 MED ORDER — TRAMADOL HCL 50 MG PO TABS: 50.0000 mg | ORAL_TABLET | Freq: Four times a day (QID) | ORAL | 0 refills | Status: DC | PRN

## 2016-06-05 NOTE — Telephone Encounter (Signed)
y

## 2016-06-05 NOTE — Telephone Encounter (Signed)
rx called to pharmacy 

## 2016-06-14 ENCOUNTER — Other Ambulatory Visit (INDEPENDENT_AMBULATORY_CARE_PROVIDER_SITE_OTHER): Payer: Self-pay | Admitting: Orthopedic Surgery

## 2016-06-14 NOTE — Telephone Encounter (Signed)
y

## 2016-06-26 ENCOUNTER — Other Ambulatory Visit (INDEPENDENT_AMBULATORY_CARE_PROVIDER_SITE_OTHER): Payer: Self-pay | Admitting: Orthopedic Surgery

## 2016-06-27 ENCOUNTER — Other Ambulatory Visit (INDEPENDENT_AMBULATORY_CARE_PROVIDER_SITE_OTHER): Payer: Self-pay

## 2016-06-27 MED ORDER — ACETAMINOPHEN-CODEINE #3 300-30 MG PO TABS: 1.0000 | ORAL_TABLET | Freq: Three times a day (TID) | ORAL | 0 refills | Status: DC | PRN

## 2016-06-27 NOTE — Telephone Encounter (Signed)
Rx request 

## 2016-06-27 NOTE — Telephone Encounter (Signed)
Isee other mssg

## 2016-06-27 NOTE — Telephone Encounter (Signed)
Patrick Logan I do not see another note, did you know anything about this one?  I was just sending it because it came throught the Rx system.

## 2016-06-27 NOTE — Progress Notes (Signed)
2/3 labs look ok - infection unlikely - I have talked company rep of his shoulder replacement and conversion is possible but difficult and results may not be long lasting  - pls call and inform of same - technically speaking the guys at [facility] should deal with this - find out why he doesn't want to go back - I could do this in July  f he wants me to do it ok for t 3 1 po q 8 # 45

## 2016-07-05 ENCOUNTER — Other Ambulatory Visit: Payer: Self-pay | Admitting: Family Medicine

## 2016-07-09 ENCOUNTER — Other Ambulatory Visit (INDEPENDENT_AMBULATORY_CARE_PROVIDER_SITE_OTHER): Payer: Self-pay | Admitting: Orthopedic Surgery

## 2016-07-09 MED ORDER — ACETAMINOPHEN-CODEINE #3 300-30 MG PO TABS: 1.0000 | ORAL_TABLET | Freq: Three times a day (TID) | ORAL | 0 refills | Status: DC | PRN

## 2016-07-09 NOTE — Telephone Encounter (Signed)
No q 8  ok to rf pls call htx

## 2016-07-20 ENCOUNTER — Other Ambulatory Visit (INDEPENDENT_AMBULATORY_CARE_PROVIDER_SITE_OTHER): Payer: Self-pay | Admitting: Orthopedic Surgery

## 2016-07-20 MED ORDER — ACETAMINOPHEN-CODEINE #3 300-30 MG PO TABS: 1.0000 | ORAL_TABLET | Freq: Three times a day (TID) | ORAL | 0 refills | Status: DC | PRN

## 2016-07-20 NOTE — Telephone Encounter (Signed)
Ok to do pls clal thx 

## 2016-07-30 ENCOUNTER — Other Ambulatory Visit (INDEPENDENT_AMBULATORY_CARE_PROVIDER_SITE_OTHER): Payer: Self-pay | Admitting: Orthopedic Surgery

## 2016-07-30 DIAGNOSIS — G8929 Other chronic pain: Secondary | ICD-10-CM

## 2016-07-30 DIAGNOSIS — M25512 Pain in left shoulder: Principal | ICD-10-CM

## 2016-07-30 MED ORDER — ACETAMINOPHEN-CODEINE #3 300-30 MG PO TABS: 1.0000 | ORAL_TABLET | Freq: Two times a day (BID) | ORAL | 0 refills | Status: DC | PRN

## 2016-09-05 NOTE — H&P (Signed)
Patrick Logan Patrick Logan is an 46 y.o. male.    Chief Complaint: left shoulder pain  HPI: Pt is a 46 y.o. male complaining of left shoulder pain for multiple years. Pain had continually increased since the beginning. X-rays in the clinic show previous hemi arthroplasty that continues to cause pain. Pt has tried various conservative treatments which have failed to alleviate their symptoms, including injections and therapy. Various options are discussed with the patient. Risks, benefits and expectations were discussed with the patient. Patient understand the risks, benefits and expectations and wishes to proceed with surgery.   PCP:  Nelwyn SalisburyFry, Stephen A, MD  D/C Plans: Home  PMH: Past Medical History:  Diagnosis Date  . Allergy   . Anxiety   . Arthritis   . Asthma   . Chronic left shoulder pain   . Depression   . GERD (gastroesophageal reflux disease)   . Ulcer (HCC)     PSH: Past Surgical History:  Procedure Laterality Date  . COLONOSCOPY  2012   per Dr. Elnoria HowardHung, clear   . ESOPHAGOGASTRODUODENOSCOPY  2012   per Dr. Elnoria HowardHung, normal   . KNEE SURGERY     R 2004  and L 2008  . SHOULDER SURGERY  2008   both   . TOTAL SHOULDER ARTHROPLASTY  03/21/2012   Procedure: TOTAL SHOULDER ARTHROPLASTY;  Surgeon: Verlee RossettiSteven R Norris, MD;  [facility];  Service: Orthopedics;  Laterality: Left;  Left Shoudler Hemi Arthroplasty  . TOTAL SHOULDER REPLACEMENT  03/21/2012   LEFT SHOULDER    Social History:  reports that he has quit smoking. His smoking use included Cigarettes. He has a 6.25 pack-year smoking history. He has never used smokeless tobacco. He reports that he does not drink alcohol or use drugs.  Allergies:  Allergies  Allergen Reactions  . Dust Mite Extract Shortness Of Breath  . Tomato Shortness Of Breath  . Aspirin Other (See Comments)    REACTION: bronchospasm  . Ibuprofen Other (See Comments)    REACTION: bronchospasm  . Nsaids Other (See Comments)    REACTION: bronchospasm     Medications: No current facility-administered medications for this encounter.    Current Outpatient Prescriptions  Medication Sig Dispense Refill  . Acetaminophen (TYLENOL PO) Take 2 tablets by mouth daily as needed (headache).    Marland Kitchen. acetaminophen-codeine (TYLENOL #3) 300-30 MG tablet Take 1 tablet by mouth every 12 (twelve) hours as needed for moderate pain. 30 tablet 0  . albuterol (PROVENTIL HFA;VENTOLIN HFA) 108 (90 Base) MCG/ACT inhaler Inhale 2 puffs into the lungs every 4 (four) hours as needed for wheezing or shortness of breath. 1 Inhaler 5  . cetirizine (ZYRTEC) 10 MG tablet Take 10 mg by mouth daily as needed (seasonal allergies).    . clonazePAM (KLONOPIN) 1 MG tablet TAKE 1 TABLET BY MOUTH TWICE DAILY AS NEEDED FOR ANXIETY 60 tablet 5  . fluticasone (FLONASE) 50 MCG/ACT nasal spray Place 1 spray into both nostrils daily as needed (seasonal allergies).    . temazepam (RESTORIL) 30 MG capsule Take 1 capsule (30 mg total) by mouth at bedtime as needed for sleep. 30 capsule 5  . traMADol (ULTRAM) 50 MG tablet Take 1 tablet (50 mg total) by mouth every 6 (six) hours as needed. 45 tablet 0    No results found for this or any previous visit (from the past 48 hour(s)). No results found.  ROS: Pain with rom of the left upper extremity  Physical Exam:  Alert and oriented 45 y.o.  male in no acute distress Cranial nerves 2-12 intact Cervical spine: full rom with no tenderness, nv intact distally Chest: active breath sounds bilaterally, no wheeze rhonchi or rales Heart: regular rate and rhythm, no murmur Abd: non tender non distended with active bowel sounds Hip is stable with rom  Left shoulder with restricted rom due to pain and guarding nv intact distally No rashes or edema Strength 4/5 with ER and IR  Assessment/Plan Assessment: painful left shoulder hemi arthroplasty  Plan: Patient will undergo a revision left hemi arthroplasty to total shoulder by Dr. Ranell Patrick at [facility] . Risks benefits and expectations were discussed with the patient. Patient understand risks, benefits and expectations and wishes to proceed.

## 2016-09-11 NOTE — Pre-Procedure Instructions (Signed)
Solan Vosler [facility]  09/11/2016      [facility] - [place], [place] - [address] AT [facility] [address] [facility] [address] [place] [address] Phone: [phone] Fax: [phone]    Your procedure is scheduled on Friday, September 14, 2016.  Report to [facility] Admitting at 0530 A.M.  Call this number if you have problems the morning of surgery:  [phone]   Remember:  Do not eat food or drink liquids after midnight Thursday, September 13, 2016.  Take these medicines the morning of surgery with A SIP OF WATER Tylenol #4 - if needed, albuterol inhaler - if needed, cetirizine (zyrtec) - if needed, clonazepam (Klonopin) - if needed, fluticasone (flonase) - if needed  7 days prior to surgery STOP taking any Aspirin, Aleve, Naproxen, Ibuprofen, Motrin, Advil, Goody's, BC's, all herbal medications, fish oil, and all vitamins    Do not wear jewelry.  Do not wear lotions, powders, or colognes, or deodorant.  Men may shave face and neck.  Do not bring valuables to the hospital.  [facility] is not responsible for any belongings or valuables.  Contacts, dentures or bridgework may not be worn into surgery.  Leave your suitcase in the car.  After surgery it may be brought to your room.  For patients admitted to the hospital, discharge time will be determined by your treatment team.  Patients discharged the day of surgery will not be allowed to drive home.   Name and phone number of your driver:    Special instructions:   [place]- Preparing For Surgery  Before surgery, you can play an important role. Because skin is not sterile, your skin needs to be as free of germs as possible. You can reduce the number of germs on your skin by washing with CHG (chlorahexidine gluconate) Soap before surgery.  CHG is an antiseptic cleaner which kills germs and bonds with the skin to continue killing germs even after washing.  Please do not use if you have an  allergy to CHG or antibacterial soaps. If your skin becomes reddened/irritated stop using the CHG.  Do not shave (including legs and underarms) for at least 48 hours prior to first CHG shower. It is OK to shave your face.  Please follow these instructions carefully.   1. Shower the NIGHT BEFORE SURGERY and the MORNING OF SURGERY with CHG.   2. If you chose to wash your hair, wash your hair first as usual with your normal shampoo.  3. After you shampoo, rinse your hair and body thoroughly to remove the shampoo.  4. Use CHG as you would any other liquid soap. You can apply CHG directly to the skin and wash gently with a scrungie or a clean washcloth.   5. Apply the CHG Soap to your body ONLY FROM THE NECK DOWN.  Do not use on open wounds or open sores. Avoid contact with your eyes, ears, mouth and genitals (private parts). Wash genitals (private parts) with your normal soap.  6. Wash thoroughly, paying special attention to the area where your surgery will be performed.  7. Thoroughly rinse your body with warm water from the neck down.  8. DO NOT shower/wash with your normal soap after using and rinsing off the CHG Soap.  9. Pat yourself dry with a CLEAN TOWEL.   10. Wear CLEAN PAJAMAS   11. Place CLEAN SHEETS on your bed the night of your  first shower and DO NOT SLEEP WITH PETS.    Day of Surgery: Do not apply any deodorants/lotions. Please wear clean clothes to the hospital/surgery center.      Please read over the following fact sheets that you were given. Pain Booklet, MRSA Information and Surgical Site Infection Prevention

## 2016-09-12 ENCOUNTER — Encounter (HOSPITAL_COMMUNITY): Payer: Self-pay

## 2016-09-12 ENCOUNTER — Encounter (HOSPITAL_COMMUNITY)
Admission: RE | Admit: 2016-09-12 | Discharge: 2016-09-12 | Disposition: A | Discharge status: Home / Self Care | Payer: BLUE CROSS/BLUE SHIELD | Source: Ambulatory Visit | Attending: Orthopedic Surgery | Admitting: Orthopedic Surgery

## 2016-09-12 LAB — COMPREHENSIVE METABOLIC PANEL
ALBUMIN: 3.6 g/dL (ref 3.5–5.0)
ALK PHOS: 99 U/L (ref 38–126)
ALT: 27 U/L (ref 17–63)
ANION GAP: 9 (ref 5–15)
AST: 22 U/L (ref 15–41)
BUN: 18 mg/dL (ref 6–20)
CALCIUM: 9.5 mg/dL (ref 8.9–10.3)
CO2: 25 mmol/L (ref 22–32)
Chloride: 105 mmol/L (ref 101–111)
Creatinine, Ser: 1.08 mg/dL (ref 0.61–1.24)
GFR calc non Af Amer: >60 mL/min (ref >60)
GLUCOSE: 119 mg/dL — AB (ref 65–99)
POTASSIUM: 4.2 mmol/L (ref 3.5–5.1)
SODIUM: 139 mmol/L (ref 135–145)
TOTAL PROTEIN: 7.1 g/dL (ref 6.5–8.1)
Total Bilirubin: 0.5 mg/dL (ref 0.3–1.2)

## 2016-09-12 LAB — CBC
HCT: 44.8 % (ref 39.0–52.0)
Hemoglobin: 14.9 g/dL (ref 13.0–17.0)
MCH: 29.3 pg (ref 26.0–34.0)
MCHC: 33.3 g/dL (ref 30.0–36.0)
MCV: 88 fL (ref 78.0–100.0)
Platelets: 413 10*3/uL — ABNORMAL HIGH (ref 150–400)
RBC: 5.09 MIL/uL (ref 4.22–5.81)
RDW: 14.6 % (ref 11.5–15.5)
WBC: 10.9 10*3/uL — ABNORMAL HIGH (ref 4.0–10.5)

## 2016-09-12 LAB — TYPE AND SCREEN
ABO/RH(D): O POS
ANTIBODY SCREEN: NEGATIVE

## 2016-09-12 LAB — SURGICAL PCR SCREEN
MRSA, PCR: NEGATIVE
STAPHYLOCOCCUS AUREUS: NEGATIVE

## 2016-09-12 NOTE — Progress Notes (Signed)
PCP - patient states he does not currently have a PCP Cardiologist - patient denies  Chest x-ray - 03/10/2016 EKG - 03/09/2016 Stress Test - patient unsure, states [facility] - patient unsure, [facility] Cardiac Cath - patient denies  Sleep Study - 07/01/2014 CPAP - patient denies wearing   Patient denies shortness of breath, fever, cough and chest pain at PAT appointment   Patient verbalized understanding of instructions that were given to them at the PAT appointment. Patient was also instructed that they will need to review over the PAT instructions again at home before surgery.

## 2016-09-13 MED ORDER — DEXTROSE 5 % IV SOLN
3.0000 g | INTRAVENOUS | Status: AC
  Administered 2016-09-14: 3 g via INTRAVENOUS
  Filled 2016-09-13 (×2): qty 3000

## 2016-09-14 ENCOUNTER — Inpatient Hospital Stay (HOSPITAL_COMMUNITY): Payer: BLUE CROSS/BLUE SHIELD | Admitting: Certified Registered"

## 2016-09-14 ENCOUNTER — Inpatient Hospital Stay (HOSPITAL_COMMUNITY)
Admission: RE | Admit: 2016-09-14 | Discharge: 2016-09-16 | DRG: 483 | Disposition: A | Discharge status: Home / Self Care | Payer: BLUE CROSS/BLUE SHIELD | Source: Ambulatory Visit | Attending: Orthopedic Surgery | Admitting: Orthopedic Surgery

## 2016-09-14 ENCOUNTER — Encounter (HOSPITAL_COMMUNITY)
Admission: RE | Disposition: A | Discharge status: Home / Self Care | Payer: Self-pay | Source: Ambulatory Visit | Attending: Orthopedic Surgery

## 2016-09-14 ENCOUNTER — Inpatient Hospital Stay (HOSPITAL_COMMUNITY): Payer: BLUE CROSS/BLUE SHIELD

## 2016-09-14 ENCOUNTER — Encounter (HOSPITAL_COMMUNITY): Payer: Self-pay

## 2016-09-14 DIAGNOSIS — Z87891 Personal history of nicotine dependence: Secondary | ICD-10-CM

## 2016-09-14 DIAGNOSIS — T8484XA Pain due to internal orthopedic prosthetic devices, implants and grafts, initial encounter: Secondary | ICD-10-CM | POA: Diagnosis present

## 2016-09-14 DIAGNOSIS — M25512 Pain in left shoulder: Secondary | ICD-10-CM | POA: Diagnosis present

## 2016-09-14 DIAGNOSIS — Y838 Other surgical procedures as the cause of abnormal reaction of the patient, or of later complication, without mention of misadventure at the time of the procedure: Secondary | ICD-10-CM | POA: Diagnosis present

## 2016-09-14 DIAGNOSIS — K219 Gastro-esophageal reflux disease without esophagitis: Secondary | ICD-10-CM | POA: Diagnosis present

## 2016-09-14 DIAGNOSIS — Z96612 Presence of left artificial shoulder joint: Secondary | ICD-10-CM

## 2016-09-14 HISTORY — PX: TOTAL SHOULDER ARTHROPLASTY: SHX126

## 2016-09-14 SURGERY — ARTHROPLASTY, SHOULDER, TOTAL
Anesthesia: General | Site: Shoulder | Laterality: Left

## 2016-09-14 MED ORDER — PHENOL 1.4 % MT LIQD: 1.0000 | OROMUCOSAL | Status: DC | PRN

## 2016-09-14 MED ORDER — ALBUTEROL SULFATE HFA 108 (90 BASE) MCG/ACT IN AERS
INHALATION_SPRAY | RESPIRATORY_TRACT | Status: DC | PRN
  Administered 2016-09-14: 3 via RESPIRATORY_TRACT

## 2016-09-14 MED ORDER — ONDANSETRON HCL 4 MG/2ML IJ SOLN
INTRAMUSCULAR | Status: AC
  Filled 2016-09-14: qty 2

## 2016-09-14 MED ORDER — LACTATED RINGERS IV SOLN
INTRAVENOUS | Status: DC | PRN
  Administered 2016-09-14 (×2): via INTRAVENOUS

## 2016-09-14 MED ORDER — DOCUSATE SODIUM 100 MG PO CAPS
100.0000 mg | ORAL_CAPSULE | Freq: Two times a day (BID) | ORAL | Status: DC
  Administered 2016-09-14 – 2016-09-16 (×5): 100 mg via ORAL
  Filled 2016-09-14 (×5): qty 1

## 2016-09-14 MED ORDER — MENTHOL 3 MG MT LOZG: 1.0000 | LOZENGE | OROMUCOSAL | Status: DC | PRN

## 2016-09-14 MED ORDER — HYDROMORPHONE HCL 1 MG/ML IJ SOLN
0.5000 mg | INTRAMUSCULAR | Status: DC | PRN
  Administered 2016-09-14 – 2016-09-15 (×7): 1 mg via INTRAVENOUS
  Filled 2016-09-14 (×8): qty 1

## 2016-09-14 MED ORDER — ONDANSETRON HCL 4 MG/2ML IJ SOLN
INTRAMUSCULAR | Status: DC | PRN
  Administered 2016-09-14: 4 mg via INTRAVENOUS

## 2016-09-14 MED ORDER — OXYCODONE-ACETAMINOPHEN 5-325 MG PO TABS: 1.0000 | ORAL_TABLET | ORAL | 0 refills | Status: DC | PRN

## 2016-09-14 MED ORDER — METOCLOPRAMIDE HCL 5 MG PO TABS: 5.0000 mg | ORAL_TABLET | Freq: Three times a day (TID) | ORAL | Status: DC | PRN

## 2016-09-14 MED ORDER — DEXMEDETOMIDINE HCL IN NACL 200 MCG/50ML IV SOLN
INTRAVENOUS | Status: DC | PRN
  Administered 2016-09-14: 8 ug via INTRAVENOUS
  Administered 2016-09-14: 12 ug via INTRAVENOUS
  Administered 2016-09-14: 8 ug via INTRAVENOUS

## 2016-09-14 MED ORDER — PROPOFOL 10 MG/ML IV BOLUS
INTRAVENOUS | Status: AC
  Filled 2016-09-14: qty 20

## 2016-09-14 MED ORDER — 0.9 % SODIUM CHLORIDE (POUR BTL) OPTIME
TOPICAL | Status: DC | PRN
  Administered 2016-09-14: 1000 mL

## 2016-09-14 MED ORDER — ACETAMINOPHEN 325 MG PO TABS
650.0000 mg | ORAL_TABLET | Freq: Four times a day (QID) | ORAL | Status: DC | PRN
  Administered 2016-09-14 – 2016-09-15 (×2): 650 mg via ORAL
  Filled 2016-09-14 (×2): qty 2

## 2016-09-14 MED ORDER — PROPOFOL 10 MG/ML IV BOLUS
INTRAVENOUS | Status: DC | PRN
  Administered 2016-09-14: 150 mg via INTRAVENOUS

## 2016-09-14 MED ORDER — ROCURONIUM BROMIDE 50 MG/5ML IV SOLN
INTRAVENOUS | Status: AC
  Filled 2016-09-14: qty 1

## 2016-09-14 MED ORDER — OXYCODONE HCL 5 MG PO TABS
ORAL_TABLET | ORAL | Status: AC
  Filled 2016-09-14: qty 1

## 2016-09-14 MED ORDER — LORATADINE 10 MG PO TABS
10.0000 mg | ORAL_TABLET | Freq: Every day | ORAL | Status: DC
  Administered 2016-09-14 – 2016-09-16 (×3): 10 mg via ORAL
  Filled 2016-09-14 (×3): qty 1

## 2016-09-14 MED ORDER — BUPIVACAINE-EPINEPHRINE 0.25% -1:200000 IJ SOLN
INTRAMUSCULAR | Status: DC | PRN
  Administered 2016-09-14: 9 mL

## 2016-09-14 MED ORDER — ONDANSETRON HCL 4 MG PO TABS: 4.0000 mg | ORAL_TABLET | Freq: Four times a day (QID) | ORAL | Status: DC | PRN

## 2016-09-14 MED ORDER — EPHEDRINE-GUAIFENESIN 25-400 MG PO TABS: 2.0000 | ORAL_TABLET | Freq: Every day | ORAL | Status: DC | PRN

## 2016-09-14 MED ORDER — CLONAZEPAM 0.5 MG PO TABS
0.5000 mg | ORAL_TABLET | Freq: Two times a day (BID) | ORAL | Status: DC | PRN
  Filled 2016-09-14: qty 2

## 2016-09-14 MED ORDER — POLYETHYLENE GLYCOL 3350 17 G PO PACK: 17.0000 g | PACK | Freq: Every day | ORAL | Status: DC | PRN

## 2016-09-14 MED ORDER — HYDROMORPHONE HCL 1 MG/ML IJ SOLN
0.2500 mg | INTRAMUSCULAR | Status: DC | PRN
  Administered 2016-09-14: 0.5 mg via INTRAVENOUS

## 2016-09-14 MED ORDER — DEXMEDETOMIDINE HCL IN NACL 200 MCG/50ML IV SOLN
INTRAVENOUS | Status: AC
Start: 2016-09-14 — End: 2016-09-14
  Filled 2016-09-14: qty 50

## 2016-09-14 MED ORDER — CHLORHEXIDINE GLUCONATE 4 % EX LIQD: 60.0000 mL | Freq: Once | CUTANEOUS | Status: DC

## 2016-09-14 MED ORDER — METHOCARBAMOL 500 MG PO TABS: 500.0000 mg | ORAL_TABLET | Freq: Three times a day (TID) | ORAL | 1 refills | Status: DC | PRN

## 2016-09-14 MED ORDER — ROCURONIUM BROMIDE 100 MG/10ML IV SOLN
INTRAVENOUS | Status: DC | PRN
  Administered 2016-09-14: 50 mg via INTRAVENOUS

## 2016-09-14 MED ORDER — METHOCARBAMOL 500 MG PO TABS
ORAL_TABLET | ORAL | Status: AC
  Filled 2016-09-14: qty 1

## 2016-09-14 MED ORDER — SODIUM CHLORIDE 0.9 % IV SOLN
INTRAVENOUS | Status: DC
  Administered 2016-09-14: 16:00:00 via INTRAVENOUS

## 2016-09-14 MED ORDER — OXYCODONE HCL 5 MG/5ML PO SOLN: 5.0000 mg | Freq: Once | ORAL | Status: AC | PRN

## 2016-09-14 MED ORDER — PHENYLEPHRINE HCL 10 MG/ML IJ SOLN
INTRAVENOUS | Status: DC | PRN
  Administered 2016-09-14: 30 ug/min via INTRAVENOUS

## 2016-09-14 MED ORDER — METOCLOPRAMIDE HCL 5 MG/ML IJ SOLN: 5.0000 mg | Freq: Three times a day (TID) | INTRAMUSCULAR | Status: DC | PRN

## 2016-09-14 MED ORDER — DEXAMETHASONE SODIUM PHOSPHATE 10 MG/ML IJ SOLN
INTRAMUSCULAR | Status: DC | PRN
  Administered 2016-09-14: 10 mg via INTRAVENOUS

## 2016-09-14 MED ORDER — MIDAZOLAM HCL 2 MG/2ML IJ SOLN
INTRAMUSCULAR | Status: AC
  Filled 2016-09-14: qty 2

## 2016-09-14 MED ORDER — ACETAMINOPHEN-CODEINE #3 300-30 MG PO TABS
1.0000 | ORAL_TABLET | Freq: Four times a day (QID) | ORAL | Status: DC | PRN
  Administered 2016-09-15 – 2016-09-16 (×2): 1 via ORAL
  Filled 2016-09-14 (×2): qty 1

## 2016-09-14 MED ORDER — ONDANSETRON HCL 4 MG/2ML IJ SOLN: 4.0000 mg | Freq: Four times a day (QID) | INTRAMUSCULAR | Status: DC | PRN

## 2016-09-14 MED ORDER — ALBUTEROL SULFATE HFA 108 (90 BASE) MCG/ACT IN AERS
INHALATION_SPRAY | RESPIRATORY_TRACT | Status: AC
  Filled 2016-09-14: qty 6.7

## 2016-09-14 MED ORDER — METHOCARBAMOL 1000 MG/10ML IJ SOLN: 500.0000 mg | Freq: Four times a day (QID) | INTRAVENOUS | Status: DC | PRN

## 2016-09-14 MED ORDER — FENTANYL CITRATE (PF) 250 MCG/5ML IJ SOLN
INTRAMUSCULAR | Status: AC
  Filled 2016-09-14: qty 5

## 2016-09-14 MED ORDER — BUPIVACAINE-EPINEPHRINE (PF) 0.25% -1:200000 IJ SOLN
INTRAMUSCULAR | Status: AC
  Filled 2016-09-14: qty 30

## 2016-09-14 MED ORDER — BUPIVACAINE-EPINEPHRINE (PF) 0.5% -1:200000 IJ SOLN
INTRAMUSCULAR | Status: DC | PRN
  Administered 2016-09-14: 30 mL via PERINEURAL

## 2016-09-14 MED ORDER — FLUTICASONE PROPIONATE 50 MCG/ACT NA SUSP
1.0000 | Freq: Every day | NASAL | Status: DC | PRN
  Filled 2016-09-14: qty 16

## 2016-09-14 MED ORDER — METHOCARBAMOL 500 MG PO TABS
500.0000 mg | ORAL_TABLET | Freq: Four times a day (QID) | ORAL | Status: DC | PRN
  Administered 2016-09-14 – 2016-09-16 (×5): 500 mg via ORAL
  Filled 2016-09-14 (×4): qty 1

## 2016-09-14 MED ORDER — OXYCODONE HCL 5 MG PO TABS
5.0000 mg | ORAL_TABLET | Freq: Once | ORAL | Status: AC | PRN
  Administered 2016-09-14: 5 mg via ORAL

## 2016-09-14 MED ORDER — FENTANYL CITRATE (PF) 100 MCG/2ML IJ SOLN
INTRAMUSCULAR | Status: DC | PRN
  Administered 2016-09-14 (×5): 50 ug via INTRAVENOUS

## 2016-09-14 MED ORDER — OXYCODONE HCL 5 MG PO TABS
5.0000 mg | ORAL_TABLET | ORAL | Status: DC | PRN
  Administered 2016-09-14 – 2016-09-15 (×8): 10 mg via ORAL
  Filled 2016-09-14 (×8): qty 2

## 2016-09-14 MED ORDER — LIDOCAINE HCL (CARDIAC) 20 MG/ML IV SOLN
INTRAVENOUS | Status: AC
  Filled 2016-09-14: qty 5

## 2016-09-14 MED ORDER — ALBUTEROL SULFATE (2.5 MG/3ML) 0.083% IN NEBU: 2.5000 mg | INHALATION_SOLUTION | RESPIRATORY_TRACT | Status: DC | PRN

## 2016-09-14 MED ORDER — ACETAMINOPHEN 650 MG RE SUPP: 650.0000 mg | Freq: Four times a day (QID) | RECTAL | Status: DC | PRN

## 2016-09-14 MED ORDER — THROMBIN 5000 UNITS EX SOLR
CUTANEOUS | Status: AC
  Filled 2016-09-14: qty 5000

## 2016-09-14 MED ORDER — LIDOCAINE HCL (CARDIAC) 20 MG/ML IV SOLN
INTRAVENOUS | Status: DC | PRN
  Administered 2016-09-14: 100 mg via INTRAVENOUS

## 2016-09-14 MED ORDER — CEFAZOLIN SODIUM-DEXTROSE 2-4 GM/100ML-% IV SOLN
2.0000 g | Freq: Four times a day (QID) | INTRAVENOUS | Status: AC
  Administered 2016-09-14 – 2016-09-15 (×3): 2 g via INTRAVENOUS
  Filled 2016-09-14 (×4): qty 100

## 2016-09-14 MED ORDER — SUGAMMADEX SODIUM 200 MG/2ML IV SOLN
INTRAVENOUS | Status: DC | PRN
  Administered 2016-09-14: 200 mg via INTRAVENOUS

## 2016-09-14 MED ORDER — DEXAMETHASONE SODIUM PHOSPHATE 10 MG/ML IJ SOLN
INTRAMUSCULAR | Status: AC
  Filled 2016-09-14: qty 1

## 2016-09-14 MED ORDER — MIDAZOLAM HCL 5 MG/5ML IJ SOLN
INTRAMUSCULAR | Status: DC | PRN
  Administered 2016-09-14: 2 mg via INTRAVENOUS

## 2016-09-14 MED ORDER — HYDROMORPHONE HCL 1 MG/ML IJ SOLN
INTRAMUSCULAR | Status: AC
  Administered 2016-09-14: 1 mg
  Filled 2016-09-14: qty 0.5

## 2016-09-14 MED ORDER — THROMBIN 5000 UNITS EX SOLR
CUTANEOUS | Status: DC | PRN
  Administered 2016-09-14: 5000 [IU] via TOPICAL

## 2016-09-14 SURGICAL SUPPLY — 83 items
ADH SKN CLS APL DERMABOND .7 (GAUZE/BANDAGES/DRESSINGS) ×1
BIT DRILL 5/64X5 DISP (BIT) ×3 IMPLANT
BLADE SAW SAG 73X25 THK (BLADE) ×2
BLADE SAW SGTL 73X25 THK (BLADE) ×1 IMPLANT
BUR SURG 4X8 MED (BURR) IMPLANT
BURR SURG 4MMX8MM MEDIUM (BURR)
BURR SURG 4X8 MED (BURR)
CEMENT HV SMART SET (Cement) ×2 IMPLANT
CLOSURE WOUND 1/2 X4 (GAUZE/BANDAGES/DRESSINGS) ×1
CONT SPEC 4OZ CLIKSEAL STRL BL (MISCELLANEOUS) ×2 IMPLANT
COVER SURGICAL LIGHT HANDLE (MISCELLANEOUS) ×3 IMPLANT
DERMABOND ADVANCED (GAUZE/BANDAGES/DRESSINGS) ×2
DERMABOND ADVANCED .7 DNX12 (GAUZE/BANDAGES/DRESSINGS) IMPLANT
DRAPE IMP U-DRAPE 54X76 (DRAPES) ×3 IMPLANT
DRAPE INCISE IOBAN 66X45 STRL (DRAPES) ×2 IMPLANT
DRAPE ORTHO SPLIT 77X108 STRL (DRAPES) ×6
DRAPE SURG ORHT 6 SPLT 77X108 (DRAPES) ×2 IMPLANT
DRAPE U-SHAPE 47X51 STRL (DRAPES) ×3 IMPLANT
DRSG ADAPTIC 3X8 NADH LF (GAUZE/BANDAGES/DRESSINGS) ×3 IMPLANT
DRSG AQUACEL AG ADV 3.5X10 (GAUZE/BANDAGES/DRESSINGS) ×2 IMPLANT
DRSG PAD ABDOMINAL 8X10 ST (GAUZE/BANDAGES/DRESSINGS) ×2 IMPLANT
DURAPREP 26ML APPLICATOR (WOUND CARE) ×3 IMPLANT
ELECT BLADE 4.0 EZ CLEAN MEGAD (MISCELLANEOUS) ×3
ELECT NDL TIP 2.8 STRL (NEEDLE) ×1 IMPLANT
ELECT NEEDLE TIP 2.8 STRL (NEEDLE) ×3 IMPLANT
ELECT REM PT RETURN 9FT ADLT (ELECTROSURGICAL) ×3
ELECTRODE BLDE 4.0 EZ CLN MEGD (MISCELLANEOUS) ×1 IMPLANT
ELECTRODE REM PT RTRN 9FT ADLT (ELECTROSURGICAL) ×1 IMPLANT
GAUZE SPONGE 4X4 12PLY STRL (GAUZE/BANDAGES/DRESSINGS) ×3 IMPLANT
GLENOID ANCHOR PEG CROSSLK 44 (Orthopedic Implant) ×2 IMPLANT
GLOVE BIOGEL PI ORTHO PRO 7.5 (GLOVE) ×2
GLOVE BIOGEL PI ORTHO PRO SZ7 (GLOVE) ×2
GLOVE BIOGEL PI ORTHO PRO SZ8 (GLOVE) ×2
GLOVE ORTHO TXT STRL SZ7.5 (GLOVE) ×3 IMPLANT
GLOVE PI ORTHO PRO STRL 7.5 (GLOVE) ×1 IMPLANT
GLOVE PI ORTHO PRO STRL SZ7 (GLOVE) ×1 IMPLANT
GLOVE PI ORTHO PRO STRL SZ8 (GLOVE) ×1 IMPLANT
GLOVE SURG ORTHO 8.5 STRL (GLOVE) ×6 IMPLANT
GOWN STRL REUS W/ TWL XL LVL3 (GOWN DISPOSABLE) ×3 IMPLANT
GOWN STRL REUS W/TWL XL LVL3 (GOWN DISPOSABLE) ×9
HANDPIECE INTERPULSE COAX TIP (DISPOSABLE)
HEAD HUM ECCENTRIC 44X18 STRL (Trauma) ×2 IMPLANT
KIT BASIN OR (CUSTOM PROCEDURE TRAY) ×3 IMPLANT
KIT ROOM TURNOVER OR (KITS) ×3 IMPLANT
MANIFOLD NEPTUNE II (INSTRUMENTS) ×3 IMPLANT
NDL 1/2 CIR MAYO (NEEDLE) ×1 IMPLANT
NDL HYPO 25GX1X1/2 BEV (NEEDLE) ×1 IMPLANT
NDL SUT 6 .5 CRC .975X.05 MAYO (NEEDLE) ×1 IMPLANT
NEEDLE 1/2 CIR MAYO (NEEDLE) IMPLANT
NEEDLE HYPO 25GX1X1/2 BEV (NEEDLE) ×3 IMPLANT
NEEDLE MAYO TAPER (NEEDLE) ×3
NS IRRIG 1000ML POUR BTL (IV SOLUTION) ×3 IMPLANT
PACK SHOULDER (CUSTOM PROCEDURE TRAY) ×3 IMPLANT
PAD ABD 8X10 STRL (GAUZE/BANDAGES/DRESSINGS) ×2 IMPLANT
PAD ARMBOARD 7.5X6 YLW CONV (MISCELLANEOUS) ×6 IMPLANT
PIN METAGLENE 2.5 (PIN) ×2 IMPLANT
SET HNDPC FAN SPRY TIP SCT (DISPOSABLE) IMPLANT
SLING ARM IMMOBILIZER LRG (SOFTGOODS) ×1 IMPLANT
SLING ARM IMMOBILIZER MED (SOFTGOODS) IMPLANT
SMARTMIX MINI TOWER (MISCELLANEOUS) ×3
SPONGE LAP 18X18 X RAY DECT (DISPOSABLE) ×3 IMPLANT
SPONGE LAP 4X18 X RAY DECT (DISPOSABLE) ×3 IMPLANT
SPONGE SURGIFOAM ABS GEL SZ50 (HEMOSTASIS) ×2 IMPLANT
STRIP CLOSURE SKIN 1/2X4 (GAUZE/BANDAGES/DRESSINGS) ×2 IMPLANT
SUCTION FRAZIER HANDLE 10FR (MISCELLANEOUS) ×2
SUCTION TUBE FRAZIER 10FR DISP (MISCELLANEOUS) ×1 IMPLANT
SUT FIBERWIRE #2 38 T-5 BLUE (SUTURE) ×24
SUT MNCRL AB 4-0 PS2 18 (SUTURE) ×3 IMPLANT
SUT VIC AB 0 CT1 27 (SUTURE) ×3
SUT VIC AB 0 CT1 27XBRD ANBCTR (SUTURE) ×1 IMPLANT
SUT VIC AB 0 CT2 27 (SUTURE) ×3 IMPLANT
SUT VIC AB 2-0 CT1 27 (SUTURE) ×3
SUT VIC AB 2-0 CT1 TAPERPNT 27 (SUTURE) ×1 IMPLANT
SUT VICRYL AB 2 0 TIES (SUTURE) ×1 IMPLANT
SUTURE FIBERWR #2 38 T-5 BLUE (SUTURE) ×2 IMPLANT
SYR CONTROL 10ML LL (SYRINGE) ×3 IMPLANT
TAPE CLOTH SURG 6X10 WHT LF (GAUZE/BANDAGES/DRESSINGS) ×2 IMPLANT
TOWEL OR 17X24 6PK STRL BLUE (TOWEL DISPOSABLE) ×1 IMPLANT
TOWEL OR 17X26 10 PK STRL BLUE (TOWEL DISPOSABLE) ×1 IMPLANT
TOWER SMARTMIX MINI (MISCELLANEOUS) ×1 IMPLANT
TRAY FOLEY W/METER SILVER 16FR (SET/KITS/TRAYS/PACK) ×1 IMPLANT
WATER STERILE IRR 1000ML POUR (IV SOLUTION) ×1 IMPLANT
YANKAUER SUCT BULB TIP NO VENT (SUCTIONS) ×2 IMPLANT

## 2016-09-14 NOTE — Progress Notes (Signed)
Orthopedics Post Op Note  Subjective: Patient comfortable, block still working  Objective:  Vitals:   09/14/16 1215 09/14/16 1300  BP: 111/78 139/82  Pulse: 86 100  Resp: 15 16  Temp: 97.8 F (36.6 C) 97.7 F (36.5 C)    General: Awake and alert  Musculoskeletal: left shoulder dressing intact, warm well perfused hand Block still effective  Lab Results  Component Value Date   WBC 10.9 (H) 09/12/2016   HGB 14.9 09/12/2016   HCT 44.8 09/12/2016   MCV 88.0 09/12/2016   PLT 413 (H) 09/12/2016       Component Value Date/Time   NA 139 09/12/2016 1006   K 4.2 09/12/2016 1006   CL 105 09/12/2016 1006   CO2 25 09/12/2016 1006   GLUCOSE 119 (H) 09/12/2016 1006   BUN 18 09/12/2016 1006   CREATININE 1.08 09/12/2016 1006   CREATININE 0.94 02/14/2012 1019   CALCIUM 9.5 09/12/2016 1006   GFRNONAA >60 09/12/2016 1006   GFRAA >60 09/12/2016 1006    No results found for: INR, PROTIME  Assessment/Plan:  s/p Procedure(s): Left shoulder revision to Total shoulder arthroplasty Stable after surgery, OT tomorrow and plan to discharge to home  F/U in two weeks in the office  Viviann SpareSteven R. Ranell PatrickNorris, MD 09/14/2016 3:28 PM

## 2016-09-14 NOTE — Discharge Instructions (Signed)
Keep the incision clean and dry and covered for one week, then ok to get it wet in the shower.  Wear the sling while up and around and for sleep to protect the shoulder.  Ice to the shoulder as much as you can.  Be gentle with the shoulder, no push pull or lift.  Do exercises as instructed every hour while awake to prevent stiffness.  Follow up with Dr Ranell PatrickNorris in two weeks in the office [phone]

## 2016-09-14 NOTE — Anesthesia Procedure Notes (Signed)
Anesthesia Regional Block: Interscalene brachial plexus block   Pre-Anesthetic Checklist: ,, timeout performed, Correct Patient, Correct Site, Correct Laterality, Correct Procedure, Correct Position, site marked, Risks and benefits discussed,  Surgical consent,  Pre-op evaluation,  At surgeon's request and post-op pain management  Laterality: Left  Prep: chloraprep       Needles:  Injection technique: Single-shot  Needle Type: Echogenic Stimulator Needle     Needle Length: 5cm  Needle Gauge: 22     Additional Needles:   Procedures: ultrasound guided, nerve stimulator,,,,,,   Nerve Stimulator or Paresthesia:  Response: biceps flexion, 0.45 mA,   Additional Responses:   Narrative:  Start time: 09/14/2016 7:06 AM End time: 09/14/2016 7:15 AM Injection made incrementally with aspirations every 5 mL.  Performed by: Personally  Anesthesiologist: Skie Vitrano  Additional Notes: Functioning IV was confirmed and monitors were applied.  A 50mm 22ga Arrow echogenic stimulator needle was used. Sterile prep and drape,hand hygiene and sterile gloves were used.  Negative aspiration and negative test dose prior to incremental administration of local anesthetic. The patient tolerated the procedure well.  Ultrasound guidance: relevent anatomy identified, needle position confirmed, local anesthetic spread visualized around nerve(s), vascular puncture avoided.  Image printed for medical record.

## 2016-09-14 NOTE — Brief Op Note (Signed)
09/14/2016  10:23 AM  PATIENT:  Patrick Logan  46 y.o. male  PRE-OPERATIVE DIAGNOSIS:  Left shoulder pain after hemiarthroplasty   POST-OPERATIVE DIAGNOSIS:  Left shoulder pain after hemiarthroplasty   PROCEDURE:  Procedure(s): Left shoulder revision to Total shoulder arthroplasty (Left) [employer]em and APG glenoid  SURGEON:  Surgeon(s) and Role:    Beverely Low* Raquell Richer, MD - Primary  PHYSICIAN ASSISTANT:   ASSISTANTS: Gigi GinSteven Chabon PA-C   ANESTHESIA:   regional and general  EBL:  Total I/O In: 1400 [I.V.:1400] Out: 150 [Blood:150]  BLOOD ADMINISTERED:none  DRAINS: none   LOCAL MEDICATIONS USED:  MARCAINE     SPECIMEN:  No Specimen  DISPOSITION OF SPECIMEN:  N/A  COUNTS:  YES  TOURNIQUET:  * No tourniquets in log *  DICTATION: .Other Dictation: Dictation Number [id number]  PLAN OF CARE: Admit to inpatient   PATIENT DISPOSITION:  PACU - hemodynamically stable.   Delay start of Pharmacological VTE agent (>24hrs) due to surgical blood loss or risk of bleeding: not applicable

## 2016-09-14 NOTE — Anesthesia Preprocedure Evaluation (Signed)
Anesthesia Evaluation  Patient identified by MRN, date of birth, ID band Patient awake    Reviewed: Allergy & Precautions, H&P , NPO status , Patient's Chart, lab work & pertinent test results  Airway Mallampati: II   Neck ROM: full    Dental   Pulmonary asthma , former smoker,    breath sounds clear to auscultation       Cardiovascular hypertension,  Rhythm:regular Rate:Normal     Neuro/Psych  Headaches, PSYCHIATRIC DISORDERS Anxiety Depression    GI/Hepatic GERD  ,  Endo/Other    Renal/GU      Musculoskeletal  (+) Arthritis ,   Abdominal   Peds  Hematology   Anesthesia Other Findings   Reproductive/Obstetrics                             Anesthesia Physical Anesthesia Plan  ASA: II  Anesthesia Plan: General   Post-op Pain Management:  Regional for Post-op pain   Induction: Intravenous  PONV Risk Score and Plan: 2 and Ondansetron, Dexamethasone, Treatment may vary due to age or medical condition and Midazolam  Airway Management Planned: Oral ETT  Additional Equipment:   Intra-op Plan:   Post-operative Plan: Extubation in OR  Informed Consent: I have reviewed the patients History and Physical, chart, labs and discussed the procedure including the risks, benefits and alternatives for the proposed anesthesia with the patient or authorized representative who has indicated his/her understanding and acceptance.     Plan Discussed with: CRNA, Anesthesiologist and Surgeon  Anesthesia Plan Comments:         Anesthesia Quick Evaluation

## 2016-09-14 NOTE — Anesthesia Procedure Notes (Signed)
Procedure Name: Intubation Date/Time: 09/14/2016 8:47 AM Performed by: Salli Quarry Devanshi Califf Pre-anesthesia Checklist: Patient identified, Emergency Drugs available, Suction available and Patient being monitored Patient Re-evaluated:Patient Re-evaluated prior to induction Oxygen Delivery Method: Circle System Utilized Preoxygenation: Pre-oxygenation with 100% oxygen Induction Type: IV induction Ventilation: Mask ventilation with difficulty and Oral airway inserted - appropriate to patient size Laryngoscope Size: 4 and Glidescope Grade View: Grade I Tube type: Oral Tube size: 7.5 mm Number of attempts: 1 Airway Equipment and Method: Stylet and Oral airway Placement Confirmation: ETT inserted through vocal cords under direct vision,  positive ETCO2 and breath sounds checked- equal and bilateral Secured at: 23 cm Tube secured with: Tape Dental Injury: Teeth and Oropharynx as per pre-operative assessment and Injury to lip  Comments: First DL with MAC 4. Grade 4 view. Large epiglottis. Used top of blade to lift epiglottis, grade 3 view. Attempt to pass ETT over bougie resulted in esophageal intubation. ETT removed. Patient mask ventilated. Second attempt with glidescope 4 blade, grade 1 view. Small upper lip laceration from first DL. Teeth remain as per preoperative assessment. Sharyn Dross, SRNA

## 2016-09-14 NOTE — Interval H&P Note (Signed)
History and Physical Interval Note:  09/14/2016 7:20 AM  Patrick Logan [name]  has presented today for surgery, with the diagnosis of Left shoulder pain after hemiarthroplasty   The various methods of treatment have been discussed with the patient and family. After consideration of risks, benefits and other options for treatment, the patient has consented to  Procedure(s): Left sholder revision to Total shoulder arthroplasty (Left) as a surgical intervention .  The patient's history has been reviewed, patient examined, no change in status, stable for surgery.  I have reviewed the patient's chart and labs.  Questions were answered to the patient's satisfaction.     Oluwaseyi Raffel,STEVEN R

## 2016-09-14 NOTE — Transfer of Care (Cosign Needed)
Immediate Anesthesia Transfer of Care Note  Patient: Patrick Logan  Procedure(s) Performed: Procedure(s): Left shoulder revision to Total shoulder arthroplasty (Left)  Patient Location: PACU  Anesthesia Type:GA combined with regional for post-op pain  Level of Consciousness: awake, alert  and oriented  Airway & Oxygen Therapy: Patient Spontanous Breathing and Patient connected to nasal cannula oxygen  Post-op Assessment: Report given to RN and Post -op Vital signs reviewed and stable  Post vital signs: Reviewed and stable  Last Vitals:  Vitals:   09/14/16 0559  BP: (!) 171/102  Pulse: 90  Resp: 20  Temp: 37.1 C  BP 123/88 SPO2 91% on 3L [place]. RR 28 HR 88. Patient encouraged to cough and deep breath.   Last Pain:  Vitals:   09/14/16 0559  TempSrc: Oral  PainSc: 8       Patients Stated Pain Goal: 5 (09/14/16 0559)  Complications: No apparent anesthesia complications

## 2016-09-14 NOTE — Op Note (Signed)
NAME:  Patrick Logan, Patrick Logan                     ACCOUNT NO.:  MEDICAL RECORD NO.:  [id number]  LOCATION:                                 FACILITY:  PHYSICIAN:  Almedia Balls. Ranell Patrick, M.D.      DATE OF BIRTH:  DATE OF PROCEDURE:  09/14/2016 DATE OF DISCHARGE:                              OPERATIVE REPORT   PREOPERATIVE DIAGNOSIS:  Left shoulder pain after hemiarthroplasty.  POSTOPERATIVE DIAGNOSIS:  Left shoulder pain after hemiarthroplasty with complete loss of cartilage from the glenoid.  PROCEDURE PERFORMED:  Left revision shoulder arthroplasty to total shoulder arthroplasty using DePuy anchor peg glenoid, and the patient has a [employer] stem and we placed a new 44 x 18 eccentric head on.  ATTENDING SURGEON:  Almedia Balls. Ranell Patrick, MD.  ASSISTANT:  Jaquelyn Bitter. Chabon, P.A.  ANESTHESIA:  General anesthesia plus interscalene block was used.  ESTIMATED BLOOD LOSS:  Less than 100 mL.  FLUID REPLACEMENT:  1200 mL crystalloid.  INSTRUMENT COUNTS:  Correct.  COMPLICATIONS:  There were no complications.  PROPHYLAXIS:  Perioperative antibiotics were given.  INDICATIONS:  The patient is a 46 year old male with a history of multiple prior left shoulder surgeries including more recently a shoulder hemiarthroplasty, which was done years ago.  The patient has had progressive in his pain more recently, which has gotten to the point where it is unbearable for the patient.  The patient has evidence on x- ray of loss of his cartilage space indicating loss of glenoid with cartilage due to the metal on bone contact and progressive in his pain despite conservative management over an extended period of time.  We discussed revision to total shoulder arthroplasty.  Even given the patient's young age at 47, this seems to be the best option to give him decent function and eliminate pain.  Informed risks and benefits of surgery were discussed.  Informed consent obtained.  DESCRIPTION OF PROCEDURE:   After an adequate level of anesthesia achieved, the patient was positioned in the modified beach-chair position.  Left shoulder correctly identified and sterilely prepped and draped in usual manner.  Time-out was called.  We entered the shoulder using a standard deltopectoral incision at the patient's prior incision. We went down through subcutaneous tissues with the Bovie.  Identified the deltopectoral interval and divided that also using the Bovie, placed our deep retractors.  We freed up the soft tissue plane underneath the deltoid and on top of the rotator cuff.  The rotator cuff was completely intact.  The subscapularis was very thin.  We did go ahead and released the subscapularis subperiosteally off the lesser tuberosity.  The repair had been done previously with FiberWire suture.  We retrieved those sutures.  The tendon, itself, was in good shape and the muscle was quite thin, but I was able to get it back to where it was freed off the underside of the coracoid and off the scapular neck and had a nice bounce to it.  We did go ahead and progressively externally rotate and release the soft tissue off the inferior neck of the humerus such that we could get the head off.  We used  a wedge to knock the head off and removed that.  There was no sign of any infection inside the shoulder, but we did retrieve some pseudocapsule and sent that for Gram stain, aerobic and anaerobic cultures.  We next went ahead and freed up and extended the shoulder and placed our brown retractor.  We placed our broach handle for the stem and then used a mallet and tried to impact that out just to see if it was stable and it was.  At this point, we did a 360-degree exposure of the glenoid.  There was no cartilage remaining on the glenoid.  We went ahead and found the center point for that, drilled our guide pin.  We reamed for the APG glenoid and then did our peripheral hand reaming.  We then drilled our central  peg hole and our 3 peripheral holes.  We had very good fit with a 44 APG glenoid.  We trialed it and then removed the trial.  We used a Gelfoam and thrombin on the 3 peripheral holes to dry those holes before for cementing. Then, we did the DePuy HV cement in the 3 peripheral holes and impacted the APG glenoid and held that until the cement was hard.  We had a nice stable glenoid well-supported.  We then went ahead and trialed again with a 44 x 18 eccentric trial to make sure that had good bony coverage on the head, which it did.  We then selected the real 44 x 18 eccentric head and impacted that in position, dialed posteriorly and slightly superiorly, so we had good coverage and had nice fit in there with being able to translate just about the third of the way posteriorly and inferiorly.  We had nice excursion and then went ahead and irrigated thoroughly and then repaired the subscapularis anatomically back to the lesser tuberosity into rotator interval with nice soft tissue coverage and closure.  Again, I am a little bit worried about how thin that subscap is as far as the muscle goes, but that may serve as a nice static tether there, so overall motion quite good afterwards and pleased to be able to give the patient a total as far as interfacing metal on plastic inset of metal on bone.  We irrigated thoroughly and on the way out, we performed deltopectoral repair with 0 Vicryl suture, followed by 2-0 Vicryl for subcutaneous closure, and a running Prolene with Dermabond and a sterile bandage.  The patient was awakened and taken to recovery room in stable condition.     Almedia BallsSteven R. Ranell PatrickNorris, M.D.     SRN/MEDQ  D:  09/14/2016  T:  09/14/2016  Job:  [id number]

## 2016-09-15 ENCOUNTER — Encounter (HOSPITAL_COMMUNITY): Payer: Self-pay

## 2016-09-15 LAB — BASIC METABOLIC PANEL
Anion gap: 8 (ref 5–15)
BUN: 15 mg/dL (ref 6–20)
CHLORIDE: 107 mmol/L (ref 101–111)
CO2: 23 mmol/L (ref 22–32)
CREATININE: 0.85 mg/dL (ref 0.61–1.24)
Calcium: 8.5 mg/dL — ABNORMAL LOW (ref 8.9–10.3)
GFR calc Af Amer: >60 mL/min (ref >60)
GFR calc non Af Amer: >60 mL/min (ref >60)
GLUCOSE: 118 mg/dL — AB (ref 65–99)
POTASSIUM: 4.1 mmol/L (ref 3.5–5.1)
SODIUM: 138 mmol/L (ref 135–145)

## 2016-09-15 LAB — HEMOGLOBIN AND HEMATOCRIT, BLOOD
HCT: 41.4 % (ref 39.0–52.0)
Hemoglobin: 13.2 g/dL (ref 13.0–17.0)

## 2016-09-15 MED ORDER — OXYCODONE HCL 5 MG PO TABS
10.0000 mg | ORAL_TABLET | ORAL | Status: DC | PRN
  Administered 2016-09-15 – 2016-09-16 (×5): 15 mg via ORAL
  Filled 2016-09-15 (×5): qty 3

## 2016-09-15 MED ORDER — HYDROMORPHONE HCL 1 MG/ML IJ SOLN
1.0000 mg | INTRAMUSCULAR | Status: DC | PRN
  Administered 2016-09-15 – 2016-09-16 (×5): 1.5 mg via INTRAVENOUS
  Filled 2016-09-15 (×5): qty 2

## 2016-09-15 NOTE — Progress Notes (Signed)
OT Cancellation Note  Patient Details Name: Patrick Logan Brian Burchill MRN: [id number] DOB: 12/05/1970   Cancelled Treatment:    Reason Eval/Treat Not Completed: Pain limiting ability to participate. Pt states "This is the worst pain of my life" RN notified, and is on the way with pain medicine and OT will plan to evaluate later today.   Evern BioLaura J Cadon Raczka 09/15/2016, 10:06 AM  Sherryl MangesLaura Myrth Dahan OTR/L [phone]

## 2016-09-15 NOTE — Progress Notes (Signed)
Subjective: 1 Day Post-Op Procedure(s) (LRB): Left shoulder revision to Total shoulder arthroplasty (Left) Patient reports pain as moderate.  Reports pain this morning after blood draw when his left arm was moved. Denies numbness or tingling, block is entirely worn off. Would like to try to go home today as he and Dr. Ranell PatrickNorris discussed. Dr. Ranell PatrickNorris discussed all his follow up and D/C instructions yesterday.  Objective: Vital signs in last 24 hours: Temp:  [97.6 F (36.4 C)-98.5 F (36.9 C)] 97.9 F (36.6 C) (07/21 0536) Pulse Rate:  [27-100] 84 (07/21 0536) Resp:  [15-33] 16 (07/20 1300) BP: (111-139)/(66-88) 122/75 (07/21 0536) SpO2:  [90 %-98 %] 98 % (07/21 0536)  Intake/Output from previous day: 07/20 0701 - 07/21 0700 In: 3390 [P.O.:1040; I.V.:2000; IV Piggyback:350] Out: 150 [Blood:150] Intake/Output this shift: No intake/output data recorded.   Recent Labs  09/12/16 1006 09/15/16 0510  HGB 14.9 13.2    Recent Labs  09/12/16 1006 09/15/16 0510  WBC 10.9*  --   RBC 5.09  --   HCT 44.8 41.4  PLT 413*  --     Recent Labs  09/12/16 1006 09/15/16 0510  NA 139 138  K 4.2 4.1  CL 105 107  CO2 25 23  BUN 18 15  CREATININE 1.08 0.85  GLUCOSE 119* 118*  CALCIUM 9.5 8.5*   No results for input(s): LABPT, INR in the last 72 hours.  Neurologically intact ABD soft Neurovascular intact Sensation intact distally Intact pulses distally Dorsiflexion/Plantar flexion intact Incision: dressing C/D/I and no drainage No cellulitis present Compartment soft no sign of DVT  Swelling noted around L shoulder Aquacel in place, clean and dry  Assessment/Plan: 1 Day Post-Op Procedure(s) (LRB): Left shoulder revision to Total shoulder arthroplasty (Left) Advance diet Up with therapy D/C IV fluids  D/C home later today after therapy as long as pain remains well controlled Please call me if pt does not feel ready for D/C, would hold until tomorrow if pain is not  controlled  Gibson Telleria M. 09/15/2016, 8:13 AM  [phone]

## 2016-09-15 NOTE — Discharge Summary (Addendum)
Patient ID: Patrick Logan MRN: [id number] DOB/AGE: [birth year] 46 y.o.  Admit date: 09/14/2016 Discharge date: 09/15/2016  Admission Diagnoses:  Active Problems:   S/P shoulder replacement, left   Discharge Diagnoses:  Same  Past Medical History:  Diagnosis Date  . Allergy   . Anxiety   . Arthritis   . Asthma   . Chronic left shoulder pain   . Depression   . GERD (gastroesophageal reflux disease)   . Ulcer     Surgeries: Procedure(s): Left shoulder revision to Total shoulder arthroplasty on 09/14/2016   Consultants:   Discharged Condition: Improved  Hospital Course: Jakobee Brackins is an 46 y.o. male who was admitted 09/14/2016 for operative treatment of<principal problem not specified>. Patient has severe unremitting pain that affects sleep, daily activities, and work/hobbies. After pre-op clearance the patient was taken to the operating room on 09/14/2016 and underwent  Procedure(s): Left shoulder revision to Total shoulder arthroplasty.    Patient was given perioperative antibiotics: Anti-infectives    Start     Dose/Rate Route Frequency Ordered Stop   09/14/16 1430  ceFAZolin (ANCEF) IVPB 2g/100 mL premix     2 g 200 mL/hr over 30 Minutes Intravenous Every 6 hours 09/14/16 1231 09/15/16 0214   09/14/16 0700  ceFAZolin (ANCEF) 3 g in dextrose 5 % 50 mL IVPB     3 g 130 mL/hr over 30 Minutes Intravenous To [facility]Stay [facility] 09/13/16 1214 09/14/16 0755       Patient was given sequential compression devices, early ambulation, and chemoprophylaxis to prevent DVT.  Patient benefited maximally from hospital stay and there were no complications other than difficult pain management that required another day in house to manage.     Recent vital signs: Patient Vitals for the past 24 hrs:  BP Temp Temp src Pulse Resp SpO2  09/15/16 0536 122/75 97.9 F (36.6 C) Oral 84 - 98 %  09/15/16 0021 115/66 98.5 F (36.9 C) Oral 98 - 94 %  09/14/16 2048 128/73 98.3 F (36.8  C) Oral 95 - 96 %  09/14/16 1300 139/82 97.7 F (36.5 C) Oral 100 16 95 %  09/14/16 1215 111/78 97.8 F (36.6 C) - 86 15 95 %  09/14/16 1200 123/85 - - 85 17 95 %  09/14/16 1145 131/87 - - 87 (!) 23 98 %  09/14/16 1142 - - - - - 97 %  09/14/16 1130 121/83 98 F (36.7 C) - 84 (!) 22 96 %  09/14/16 1115 121/86 - - 82 (!) 21 98 %  09/14/16 1100 120/88 - - 82 18 97 %  09/14/16 1045 128/80 - - 82 (!) 22 95 %  09/14/16 1030 112/77 - - 85 (!) 21 93 %  09/14/16 1012 123/88 97.6 F (36.4 C) - (!) 27 (!) 33 90 %     Recent laboratory studies:  Recent Labs  09/12/16 1006 09/15/16 0510  WBC 10.9*  --   HGB 14.9 13.2  HCT 44.8 41.4  PLT 413*  --   NA 139 138  K 4.2 4.1  CL 105 107  CO2 25 23  BUN 18 15  CREATININE 1.08 0.85  GLUCOSE 119* 118*  CALCIUM 9.5 8.5*     Discharge Medications:   Allergies as of 09/15/2016      Reactions   Aspirin Shortness Of Breath   REACTION: bronchospasm   Dust Mite Extract Shortness Of Breath   Ibuprofen Shortness Of Breath   REACTION: bronchospasm   Nsaids Shortness  Of Breath   REACTION: bronchospasm   Tomato Shortness Of Breath      Medication List    TAKE these medications   acetaminophen-codeine 300-60 MG tablet Commonly known as:  TYLENOL #4 Take 1 tablet by mouth every 6 (six) hours as needed for moderate pain.   albuterol 108 (90 Base) MCG/ACT inhaler Commonly known as:  PROVENTIL HFA;VENTOLIN HFA Inhale 2 puffs into the lungs every 4 (four) hours as needed for wheezing or shortness of breath.   BRONKAID PO Inhale 2 puffs into the lungs daily as needed (shortness of breath).   cetirizine 10 MG tablet Commonly known as:  ZYRTEC Take 10 mg by mouth daily as needed (seasonal allergies).   clonazePAM 1 MG tablet Commonly known as:  KLONOPIN TAKE 1 TABLET BY MOUTH TWICE DAILY AS NEEDED FOR ANXIETY   fluticasone 50 MCG/ACT nasal spray Commonly known as:  FLONASE Place 1 spray into both nostrils daily as needed (seasonal  allergies).   methocarbamol 500 MG tablet Commonly known as:  ROBAXIN Take 1 tablet (500 mg total) by mouth 3 (three) times daily as needed.   oxyCODONE IR 5 mg tablets Take 1-3 tablets every 3 hours PRN severe pain   SALONPAS GEL EX Apply 1 application topically 4 (four) times daily as needed (PAIN).       Diagnostic Studies: Dg Shoulder Left Port  Result Date: 09/14/2016 CLINICAL DATA:  Post left shoulder replacement EXAM: LEFT SHOULDER - 1 VIEW COMPARISON:  Left shoulder CT - 05/09/2016; the shoulder radiograph - 03/21/2012 FINDINGS: Post left hemi humeral arthroplasty without evidence of hardware failure or loosening. Suspected interval postoperative change of the glenoid. Alignment appears anatomic given solitary AP projection. No definite fracture. Unchanged widening of the left AC joint. Expected scattered foci of subcutaneous emphysema about the operative site. No radiopaque foreign body. Limited visualization of the adjacent thorax is normal. IMPRESSION: No evidence of complication following provided history of left total shoulder replacement. Electronically Signed   By: Simonne ComeJohn  Watts M.D.   On: 09/14/2016 11:43    Disposition: 01-Home or Self Care    Follow-up Information    Beverely LowNorris, Steve, MD. Call in 2 weeks.   Specialty:  Orthopedic Surgery Why:  [phone] Contact information: [address] [place] [address] [phone]            Signed: Dimitri Pedmber Keyunna Coco, PA-C 09/16/2016 [phone]

## 2016-09-15 NOTE — Evaluation (Signed)
Occupational Therapy Evaluation and Discharge Patient Details Name: Patrick Logan MRN: [id number] DOB: Sep 11, 1970 Today's Date: 09/15/2016    History of Present Illness Pt is a 46 y/o male s/p L total shoulder arthroplasty. Pt has a past medical history of Allergy; Anxiety; Arthritis; Asthma; Chronic left shoulder pain; Depression; GERD;  has a past surgical history that includes Knee surgery; Shoulder surgery (2008); Total shoulder replacement (03/21/2012); Total shoulder arthroplasty (03/21/2012); Esophagogastroduodenoscopy (2012); Colonoscopy (2012); and Shoulder surgery (Left).   Clinical Impression   PTA Pt independent in ADL and mobility. Pt works as a Dance movement psychotherapist, driving etc. Pt is currently modified independent in all ADL. When OT entered the room, Pt was fully dressed and per Pt and RN report pt completed independently. Pt is familiar with shoulder compensatory strategies and precautions from previous surgeries, but appreciated the reinforcement of education from therapist. Pt educated and demonstrated exercise program. Pt's biggest complaint right now is very very very intense pain in the anterior gleno-humoral joint. He says that he feels like it feels like nerve pain. RN aware and calling MD on call about it. OT education complete and will sign off with progression of shoulder rehab as ordered by MD. Thank you for this referral.    Follow Up Recommendations  DC plan and follow up therapy as arranged by surgeon    Equipment Recommendations  None recommended by OT    Recommendations for Other Services       Precautions / Restrictions Precautions Precautions: Shoulder Type of Shoulder Precautions: Conservative Protocol Shoulder Interventions: Shoulder sling/immobilizer;At all times;Off for dressing/bathing/exercises Precaution Booklet Issued: Yes (comment) Precaution Comments: Shoulder handout reviewed in full Required Braces or Orthoses: Sling Restrictions Weight  Bearing Restrictions: Yes LUE Weight Bearing: Non weight bearing      Mobility Bed Mobility Overal bed mobility: Modified Independent             General bed mobility comments: no assist needed, no attempt to push/pull with LUE  Transfers Overall transfer level: Modified independent Equipment used: None             General transfer comment: increased time, no reports of dizziness or lightheadedness    Balance                                           ADL either performed or assessed with clinical judgement   ADL Overall ADL's : Needs assistance/impaired                                     Functional mobility during ADLs: Supervision/safety General ADL Comments: please see shoulder section below for further information     Vision Baseline Vision/History: Wears glasses Wears Glasses: At all times Patient Visual Report: No change from baseline Vision Assessment?: No apparent visual deficits     Perception     Praxis      Pertinent Vitals/Pain Pain Assessment: 0-10 Pain Score: 10-Worst pain ever Pain Location: very specific pain in anterior GH joint  Pain Descriptors / Indicators: Grimacing;Constant;Sharp;Shooting;Stabbing Pain Intervention(s): Monitored during session;Repositioned;Premedicated before session (declined ice)     Hand Dominance Right   Extremity/Trunk Assessment Upper Extremity Assessment Upper Extremity Assessment: LUE deficits/detail LUE Deficits / Details: s/p surgical deficits as expected LUE: Unable to fully assess due to pain LUE Coordination:  decreased gross motor   Lower Extremity Assessment Lower Extremity Assessment: Overall WFL for tasks assessed   Cervical / Trunk Assessment Cervical / Trunk Assessment: Normal   Communication Communication Communication: No difficulties   Cognition Arousal/Alertness: Awake/alert Behavior During Therapy: WFL for tasks assessed/performed (upset about the  level of pain) Overall Cognitive Status: Within Functional Limits for tasks assessed                                     General Comments       Exercises Exercises: Shoulder Shoulder Exercises Pendulum Exercise: Left;Standing Elbow Flexion: AROM;Left;10 reps;15 reps;Standing Wrist Flexion: AROM;Left Wrist Extension: AROM;Left Digit Composite Flexion: AROM;Left Neck Flexion: AROM Neck Extension: AROM Neck Lateral Flexion - Right: AROM   Shoulder Instructions Shoulder Instructions Donning/doffing shirt without moving shoulder: Modified independent Method for sponge bathing under operated UE: Modified independent Donning/doffing sling/immobilizer: Modified independent Correct positioning of sling/immobilizer: Minimal assistance;Patient able to independently direct caregiver (for adjustment, Pt able to don independently) Pendulum exercises (written home exercise program): Modified independent ROM for elbow, wrist and digits of operated UE: Modified independent Sling wearing schedule (on at all times/off for ADL's): Modified independent Proper positioning of operated UE when showering: Modified independent Positioning of UE while sleeping: Modified independent    Home Living Family/patient expects to be discharged to:: Private residence Living Arrangements: Children Available Help at Discharge: Family;Available 24 hours/day Type of Home: House             Bathroom Shower/Tub: Chief Strategy OfficerTub/shower unit   Bathroom Toilet: Standard     Home Equipment: None          Prior Functioning/Environment Level of Independence: Independent        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         OT Problem List: Decreased range of motion;Decreased knowledge of precautions;Impaired UE functional use;Pain      OT Treatment/Interventions:      OT Goals(Current goals can be found in the care plan section) Acute Rehab OT Goals Patient Stated Goal: to get pain under  control OT Goal Formulation: With patient Potential to Achieve Goals: Good  OT Frequency:     Barriers to D/C:            Co-evaluation              AM-PAC PT "6 Clicks" Daily Activity     Outcome Measure Help from another person eating meals?: None Help from another person taking care of personal grooming?: None Help from another person toileting, which includes using toliet, bedpan, or urinal?: None Help from another person bathing (including washing, rinsing, drying)?: None Help from another person to put on and taking off regular upper body clothing?: A Little Help from another person to put on and taking off regular lower body clothing?: None 6 Click Score: 23   End of Session Equipment Utilized During Treatment: Other (comment) (sling) Nurse Communication: Mobility status;Other (comment) (pain level)  Activity Tolerance: Patient tolerated treatment well Patient left: in bed;with call bell/phone within reach  OT Visit Diagnosis: Pain Pain - Right/Left: Left Pain - part of body: Shoulder                Time: 1191-47821510-1525 OT Time Calculation (min): 15 min Charges:  OT General Charges $OT Visit: 1 Procedure OT Evaluation $OT Eval Moderate Complexity: 1 Procedure G-Codes:     Sherryl MangesLaura Maurita Havener OTR/L  [phone]  Evern Bio Jerrie Schussler 09/15/2016, 3:59 PM

## 2016-09-16 MED ORDER — OXYCODONE HCL 5 MG PO TABS: 5.0000 mg | ORAL_TABLET | ORAL | 0 refills | Status: DC | PRN

## 2016-09-16 NOTE — Progress Notes (Signed)
   Subjective: 2 Days Post-Op Procedure(s) (LRB): Left shoulder revision to Total shoulder arthroplasty (Left) Patient reports pain as moderate.   Patient seen in rounds for Dr. Ranell PatrickNorris. Patient is well, and has had no acute complaints or problems other than pain in the left shoulder. No SOB or chest pain. No numbness or tingling in the left UE.   Objective: Vital signs in last 24 hours: Temp:  [98.3 F (36.8 C)-100.1 F (37.8 C)] 100.1 F (37.8 C) (07/22 0501) Pulse Rate:  [102-112] 102 (07/22 0501) Resp:  [18] 18 (07/21 1300) BP: (127-137)/(73-87) 134/73 (07/22 0501) SpO2:  [96 %-98 %] 96 % (07/22 0501) Weight:  [116.1 kg (256 lb)] 116.1 kg (256 lb) (07/21 1300)  Intake/Output from previous day:  Intake/Output Summary (Last 24 hours) at 09/16/16 0810 Last data filed at 09/15/16 1700  Gross per 24 hour  Intake              960 ml  Output                0 ml  Net              960 ml     Labs:  Recent Labs  09/15/16 0510  HGB 13.2    Recent Labs  09/15/16 0510  HCT 41.4    Recent Labs  09/15/16 0510  NA 138  K 4.1  CL 107  CO2 23  BUN 15  CREATININE 0.85  GLUCOSE 118*  CALCIUM 8.5*    EXAM General - Patient is Alert and Oriented Extremity - Neurologically intact Sensation intact distally Intact pulses distally No cellulitis present Dressing/Incision - clean, dry, no drainage Motor Function - intact, moving hand and fingers well on exam.   Past Medical History:  Diagnosis Date  . Allergy   . Anxiety   . Arthritis   . Asthma   . Chronic left shoulder pain   . Depression   . GERD (gastroesophageal reflux disease)   . Ulcer     Assessment/Plan: 2 Days Post-Op Procedure(s) (LRB): Left shoulder revision to Total shoulder arthroplasty (Left) Active Problems:   S/P shoulder replacement, left  Estimated body mass index is 37.8 kg/m as calculated from the following:   Height as of this encounter: 5\' 9"  (1.753 m).   Weight as of this  encounter: 116.1 kg (256 lb). Advance diet Up with therapy D/C IV fluids  Patient should continue to wear sling. DC home today. Follow up with Dr. Ranell PatrickNorris in 2 weeks  Dimitri PedAmber Zoi Devine, PA-C Orthopaedic Surgery 09/16/2016, 8:10 AM

## 2016-09-16 NOTE — Care Management Note (Signed)
Case Management Note  Patient Details  Name: Patrick Logan MRN: [id number] Date of Birth: 1970/11/23  Subjective/Objective:                 Patient with order to DC to home today. Chart reviewed. No Home Health or Equipment needs/ orders nor pending therapy consults, no unacknowledged Case Management consults or medication needs identified at the time of this note. Plan for DC to home. If needs arise today prior to discharge, please call Lawerance SabalDebbie Nyomi Howser RN CM at [phone].    Action/Plan:   Expected Discharge Date:  09/16/16               Expected Discharge Plan:  Home/Self Care  In-House Referral:     Discharge planning Services  CM Consult  Post Acute Care Choice:    Choice offered to:     DME Arranged:    DME Agency:     HH Arranged:    HH Agency:     Status of Service:  Completed, signed off  If discussed at [employer] Length of Stay Meetings, dates discussed:    Additional Comments:  Lawerance SabalDebbie Aloysious Vangieson, RN 09/16/2016, 8:07 AM

## 2016-09-17 ENCOUNTER — Encounter (HOSPITAL_COMMUNITY): Payer: Self-pay | Admitting: Orthopedic Surgery

## 2016-09-17 NOTE — Anesthesia Postprocedure Evaluation (Signed)
Anesthesia Post Note  Patient: Patrick Logan Brian Prieur  Procedure(s) Performed: Procedure(s) (LRB): Left shoulder revision to Total shoulder arthroplasty (Left)     Patient location during evaluation: PACU Anesthesia Type: General Level of consciousness: awake and alert and patient cooperative Pain management: pain level controlled Vital Signs Assessment: post-procedure vital signs reviewed and stable Respiratory status: spontaneous breathing and respiratory function stable Cardiovascular status: stable Anesthetic complications: no    Last Vitals:  Vitals:   09/15/16 1956 09/16/16 0501  BP: 137/87 134/73  Pulse: (!) 112 (!) 102  Resp:    Temp: 37.3 C 37.8 C    Last Pain:  Vitals:   09/16/16 0845  TempSrc:   PainSc: 10-Worst pain ever   Pain Goal: Patients Stated Pain Goal: 5 (09/14/16 0559)               Coleton Woon S

## 2016-09-19 LAB — AEROBIC/ANAEROBIC CULTURE W GRAM STAIN (SURGICAL/DEEP WOUND): Culture: NO GROWTH

## 2016-09-19 LAB — AEROBIC/ANAEROBIC CULTURE (SURGICAL/DEEP WOUND): GRAM STAIN: NONE SEEN

## 2016-11-15 ENCOUNTER — Encounter: Payer: Self-pay | Admitting: Family Medicine

## 2017-11-27 ENCOUNTER — Encounter (HOSPITAL_COMMUNITY): Payer: Self-pay

## 2017-11-27 ENCOUNTER — Inpatient Hospital Stay (HOSPITAL_COMMUNITY)
Admission: EM | Admit: 2017-11-27 | Discharge: 2017-11-29 | DRG: 392 | Disposition: A | Discharge status: Home / Self Care | Payer: BLUE CROSS/BLUE SHIELD | Attending: Family Medicine | Admitting: Family Medicine

## 2017-11-27 ENCOUNTER — Other Ambulatory Visit: Payer: Self-pay

## 2017-11-27 DIAGNOSIS — Z833 Family history of diabetes mellitus: Secondary | ICD-10-CM

## 2017-11-27 DIAGNOSIS — Z79899 Other long term (current) drug therapy: Secondary | ICD-10-CM

## 2017-11-27 DIAGNOSIS — J45909 Unspecified asthma, uncomplicated: Secondary | ICD-10-CM | POA: Diagnosis present

## 2017-11-27 DIAGNOSIS — Z8349 Family history of other endocrine, nutritional and metabolic diseases: Secondary | ICD-10-CM

## 2017-11-27 DIAGNOSIS — A09 Infectious gastroenteritis and colitis, unspecified: Principal | ICD-10-CM | POA: Diagnosis present

## 2017-11-27 DIAGNOSIS — Z811 Family history of alcohol abuse and dependence: Secondary | ICD-10-CM

## 2017-11-27 DIAGNOSIS — E860 Dehydration: Secondary | ICD-10-CM | POA: Diagnosis present

## 2017-11-27 DIAGNOSIS — Z818 Family history of other mental and behavioral disorders: Secondary | ICD-10-CM

## 2017-11-27 DIAGNOSIS — I4581 Long QT syndrome: Secondary | ICD-10-CM | POA: Diagnosis present

## 2017-11-27 DIAGNOSIS — K760 Fatty (change of) liver, not elsewhere classified: Secondary | ICD-10-CM | POA: Diagnosis present

## 2017-11-27 DIAGNOSIS — A419 Sepsis, unspecified organism: Secondary | ICD-10-CM

## 2017-11-27 DIAGNOSIS — N179 Acute kidney failure, unspecified: Secondary | ICD-10-CM | POA: Diagnosis present

## 2017-11-27 DIAGNOSIS — E669 Obesity, unspecified: Secondary | ICD-10-CM | POA: Diagnosis present

## 2017-11-27 DIAGNOSIS — Z9109 Other allergy status, other than to drugs and biological substances: Secondary | ICD-10-CM

## 2017-11-27 DIAGNOSIS — R1110 Vomiting, unspecified: Secondary | ICD-10-CM

## 2017-11-27 DIAGNOSIS — K219 Gastro-esophageal reflux disease without esophagitis: Secondary | ICD-10-CM | POA: Diagnosis present

## 2017-11-27 DIAGNOSIS — Z91018 Allergy to other foods: Secondary | ICD-10-CM

## 2017-11-27 DIAGNOSIS — R197 Diarrhea, unspecified: Secondary | ICD-10-CM

## 2017-11-27 DIAGNOSIS — Z8249 Family history of ischemic heart disease and other diseases of the circulatory system: Secondary | ICD-10-CM

## 2017-11-27 DIAGNOSIS — R112 Nausea with vomiting, unspecified: Secondary | ICD-10-CM

## 2017-11-27 DIAGNOSIS — R9431 Abnormal electrocardiogram [ECG] [EKG]: Secondary | ICD-10-CM

## 2017-11-27 DIAGNOSIS — Z96612 Presence of left artificial shoulder joint: Secondary | ICD-10-CM | POA: Diagnosis present

## 2017-11-27 DIAGNOSIS — Z8379 Family history of other diseases of the digestive system: Secondary | ICD-10-CM

## 2017-11-27 DIAGNOSIS — Z886 Allergy status to analgesic agent status: Secondary | ICD-10-CM

## 2017-11-27 DIAGNOSIS — I4519 Other right bundle-branch block: Secondary | ICD-10-CM | POA: Diagnosis present

## 2017-11-27 DIAGNOSIS — F418 Other specified anxiety disorders: Secondary | ICD-10-CM | POA: Diagnosis present

## 2017-11-27 DIAGNOSIS — F17211 Nicotine dependence, cigarettes, in remission: Secondary | ICD-10-CM | POA: Diagnosis present

## 2017-11-27 DIAGNOSIS — Z6825 Body mass index (BMI) 25.0-25.9, adult: Secondary | ICD-10-CM

## 2017-11-27 LAB — COMPREHENSIVE METABOLIC PANEL
ALT: 29 U/L (ref 0–44)
AST: 26 U/L (ref 15–41)
Albumin: 5.2 g/dL — ABNORMAL HIGH (ref 3.5–5.0)
Alkaline Phosphatase: 120 U/L (ref 38–126)
Anion gap: 16 — ABNORMAL HIGH (ref 5–15)
BUN: 24 mg/dL — ABNORMAL HIGH (ref 6–20)
CHLORIDE: 103 mmol/L (ref 98–111)
CO2: 19 mmol/L — ABNORMAL LOW (ref 22–32)
Calcium: 10.4 mg/dL — ABNORMAL HIGH (ref 8.9–10.3)
Creatinine, Ser: 2.24 mg/dL — ABNORMAL HIGH (ref 0.61–1.24)
GFR, EST AFRICAN AMERICAN: 39 mL/min — AB (ref >60)
GFR, EST NON AFRICAN AMERICAN: 33 mL/min — AB (ref >60)
Glucose, Bld: 134 mg/dL — ABNORMAL HIGH (ref 70–99)
POTASSIUM: 3.6 mmol/L (ref 3.5–5.1)
Sodium: 138 mmol/L (ref 135–145)
Total Bilirubin: 0.8 mg/dL (ref 0.3–1.2)
Total Protein: 9.9 g/dL — ABNORMAL HIGH (ref 6.5–8.1)

## 2017-11-27 LAB — CBC WITH DIFFERENTIAL/PLATELET
BASOS PCT: 0 %
Basophils Absolute: 0 10*3/uL (ref 0.0–0.1)
EOS ABS: 0.1 10*3/uL (ref 0.0–0.7)
Eosinophils Relative: 0 %
HCT: 54.4 % — ABNORMAL HIGH (ref 39.0–52.0)
HEMOGLOBIN: 18.8 g/dL — AB (ref 13.0–17.0)
Lymphocytes Relative: 15 %
Lymphs Abs: 3 10*3/uL (ref 0.7–4.0)
MCH: 30.2 pg (ref 26.0–34.0)
MCHC: 34.6 g/dL (ref 30.0–36.0)
MCV: 87.5 fL (ref 78.0–100.0)
Monocytes Absolute: 1.2 10*3/uL — ABNORMAL HIGH (ref 0.1–1.0)
Monocytes Relative: 6 %
NEUTROS PCT: 79 %
Neutro Abs: 15.1 10*3/uL — ABNORMAL HIGH (ref 1.7–7.7)
Platelets: 491 10*3/uL — ABNORMAL HIGH (ref 150–400)
RBC: 6.22 MIL/uL — AB (ref 4.22–5.81)
RDW: 15 % (ref 11.5–15.5)
WBC: 19.3 10*3/uL — AB (ref 4.0–10.5)

## 2017-11-27 LAB — TROPONIN I

## 2017-11-27 LAB — I-STAT CG4 LACTIC ACID, ED: LACTIC ACID, VENOUS: 1.98 mmol/L — AB (ref 0.5–1.9)

## 2017-11-27 MED ORDER — LACTATED RINGERS IV BOLUS
1000.0000 mL | Freq: Once | INTRAVENOUS | Status: AC
  Administered 2017-11-27: 1000 mL via INTRAVENOUS

## 2017-11-27 MED ORDER — MORPHINE SULFATE (PF) 4 MG/ML IV SOLN
4.0000 mg | Freq: Once | INTRAVENOUS | Status: AC
  Administered 2017-11-27: 4 mg via INTRAVENOUS
  Filled 2017-11-27: qty 1

## 2017-11-27 NOTE — ED Notes (Signed)
Bed: VW09 Expected date:  Expected time:  Means of arrival:  Comments: T3

## 2017-11-27 NOTE — ED Triage Notes (Addendum)
Per EMS pt from home with complaints of N/V/D since yesterday. EMS gave Zofran 4 mg. Upon assessment patient is pale and diaphoretic. Rash noted to both arms and legs and abd.   20 LAC BP 122/70 HR 110 99%

## 2017-11-28 ENCOUNTER — Emergency Department (HOSPITAL_COMMUNITY): Payer: BLUE CROSS/BLUE SHIELD

## 2017-11-28 ENCOUNTER — Encounter (HOSPITAL_COMMUNITY): Payer: Self-pay

## 2017-11-28 DIAGNOSIS — E860 Dehydration: Secondary | ICD-10-CM | POA: Diagnosis not present

## 2017-11-28 DIAGNOSIS — R9431 Abnormal electrocardiogram [ECG] [EKG]: Secondary | ICD-10-CM | POA: Diagnosis not present

## 2017-11-28 DIAGNOSIS — Z79899 Other long term (current) drug therapy: Secondary | ICD-10-CM | POA: Diagnosis not present

## 2017-11-28 DIAGNOSIS — Z811 Family history of alcohol abuse and dependence: Secondary | ICD-10-CM | POA: Diagnosis not present

## 2017-11-28 DIAGNOSIS — Z833 Family history of diabetes mellitus: Secondary | ICD-10-CM | POA: Diagnosis not present

## 2017-11-28 DIAGNOSIS — Z9109 Other allergy status, other than to drugs and biological substances: Secondary | ICD-10-CM | POA: Diagnosis not present

## 2017-11-28 DIAGNOSIS — J45909 Unspecified asthma, uncomplicated: Secondary | ICD-10-CM | POA: Diagnosis present

## 2017-11-28 DIAGNOSIS — A09 Infectious gastroenteritis and colitis, unspecified: Secondary | ICD-10-CM | POA: Diagnosis present

## 2017-11-28 DIAGNOSIS — N179 Acute kidney failure, unspecified: Secondary | ICD-10-CM | POA: Diagnosis not present

## 2017-11-28 DIAGNOSIS — R197 Diarrhea, unspecified: Secondary | ICD-10-CM

## 2017-11-28 DIAGNOSIS — K219 Gastro-esophageal reflux disease without esophagitis: Secondary | ICD-10-CM | POA: Diagnosis present

## 2017-11-28 DIAGNOSIS — Z8249 Family history of ischemic heart disease and other diseases of the circulatory system: Secondary | ICD-10-CM | POA: Diagnosis not present

## 2017-11-28 DIAGNOSIS — I4581 Long QT syndrome: Secondary | ICD-10-CM | POA: Diagnosis present

## 2017-11-28 DIAGNOSIS — Z8349 Family history of other endocrine, nutritional and metabolic diseases: Secondary | ICD-10-CM | POA: Diagnosis not present

## 2017-11-28 DIAGNOSIS — F17211 Nicotine dependence, cigarettes, in remission: Secondary | ICD-10-CM | POA: Diagnosis present

## 2017-11-28 DIAGNOSIS — E669 Obesity, unspecified: Secondary | ICD-10-CM | POA: Diagnosis present

## 2017-11-28 DIAGNOSIS — Z6825 Body mass index (BMI) 25.0-25.9, adult: Secondary | ICD-10-CM | POA: Diagnosis not present

## 2017-11-28 DIAGNOSIS — Z818 Family history of other mental and behavioral disorders: Secondary | ICD-10-CM | POA: Diagnosis not present

## 2017-11-28 DIAGNOSIS — F418 Other specified anxiety disorders: Secondary | ICD-10-CM | POA: Diagnosis present

## 2017-11-28 DIAGNOSIS — K760 Fatty (change of) liver, not elsewhere classified: Secondary | ICD-10-CM | POA: Diagnosis present

## 2017-11-28 DIAGNOSIS — Z96612 Presence of left artificial shoulder joint: Secondary | ICD-10-CM | POA: Diagnosis present

## 2017-11-28 DIAGNOSIS — I4519 Other right bundle-branch block: Secondary | ICD-10-CM | POA: Diagnosis present

## 2017-11-28 DIAGNOSIS — Z886 Allergy status to analgesic agent status: Secondary | ICD-10-CM | POA: Diagnosis not present

## 2017-11-28 DIAGNOSIS — Z8379 Family history of other diseases of the digestive system: Secondary | ICD-10-CM | POA: Diagnosis not present

## 2017-11-28 DIAGNOSIS — Z91018 Allergy to other foods: Secondary | ICD-10-CM | POA: Diagnosis not present

## 2017-11-28 DIAGNOSIS — A419 Sepsis, unspecified organism: Secondary | ICD-10-CM

## 2017-11-28 DIAGNOSIS — R6520 Severe sepsis without septic shock: Secondary | ICD-10-CM

## 2017-11-28 DIAGNOSIS — R112 Nausea with vomiting, unspecified: Secondary | ICD-10-CM | POA: Diagnosis not present

## 2017-11-28 LAB — CREATININE, SERUM
Creatinine, Ser: 1.42 mg/dL — ABNORMAL HIGH (ref 0.61–1.24)
GFR calc Af Amer: >60 mL/min (ref >60)
GFR calc non Af Amer: 58 mL/min — ABNORMAL LOW (ref >60)

## 2017-11-28 LAB — GASTROINTESTINAL PANEL BY PCR, STOOL (REPLACES STOOL CULTURE)
ADENOVIRUS F40/41: NOT DETECTED
ASTROVIRUS: NOT DETECTED
CRYPTOSPORIDIUM: NOT DETECTED
CYCLOSPORA CAYETANENSIS: NOT DETECTED
Campylobacter species: NOT DETECTED
ENTAMOEBA HISTOLYTICA: NOT DETECTED
ENTEROAGGREGATIVE E COLI (EAEC): NOT DETECTED
ENTEROTOXIGENIC E COLI (ETEC): NOT DETECTED
Enteropathogenic E coli (EPEC): NOT DETECTED
GIARDIA LAMBLIA: NOT DETECTED
Norovirus GI/GII: NOT DETECTED
Plesimonas shigelloides: NOT DETECTED
Rotavirus A: NOT DETECTED
Salmonella species: NOT DETECTED
Sapovirus (I, II, IV, and V): NOT DETECTED
Shiga like toxin producing E coli (STEC): NOT DETECTED
Shigella/Enteroinvasive E coli (EIEC): NOT DETECTED
VIBRIO CHOLERAE: NOT DETECTED
Vibrio species: NOT DETECTED
YERSINIA ENTEROCOLITICA: NOT DETECTED

## 2017-11-28 LAB — CBC
HEMATOCRIT: 44.4 % (ref 39.0–52.0)
Hemoglobin: 14.9 g/dL (ref 13.0–17.0)
MCH: 29.5 pg (ref 26.0–34.0)
MCHC: 33.6 g/dL (ref 30.0–36.0)
MCV: 87.9 fL (ref 78.0–100.0)
PLATELETS: 407 10*3/uL — AB (ref 150–400)
RBC: 5.05 MIL/uL (ref 4.22–5.81)
RDW: 14.9 % (ref 11.5–15.5)
WBC: 9.8 10*3/uL (ref 4.0–10.5)

## 2017-11-28 LAB — C DIFFICILE QUICK SCREEN W PCR REFLEX
C DIFFICILE (CDIFF) INTERP: NOT DETECTED
C Diff antigen: NEGATIVE
C Diff toxin: NEGATIVE

## 2017-11-28 LAB — I-STAT CG4 LACTIC ACID, ED: Lactic Acid, Venous: 1.27 mmol/L (ref 0.5–1.9)

## 2017-11-28 MED ORDER — ONDANSETRON HCL 4 MG/2ML IJ SOLN
4.0000 mg | Freq: Once | INTRAMUSCULAR | Status: AC
  Administered 2017-11-28: 4 mg via INTRAVENOUS
  Filled 2017-11-28: qty 2

## 2017-11-28 MED ORDER — PIPERACILLIN-TAZOBACTAM 3.375 G IVPB 30 MIN
3.3750 g | INTRAVENOUS | Status: AC
  Administered 2017-11-28: 3.375 g via INTRAVENOUS
  Filled 2017-11-28: qty 50

## 2017-11-28 MED ORDER — MORPHINE SULFATE (PF) 4 MG/ML IV SOLN
4.0000 mg | Freq: Once | INTRAVENOUS | Status: AC
  Administered 2017-11-28: 4 mg via INTRAVENOUS
  Filled 2017-11-28: qty 1

## 2017-11-28 MED ORDER — MAGNESIUM SULFATE IN D5W 1-5 GM/100ML-% IV SOLN
1.0000 g | Freq: Once | INTRAVENOUS | Status: AC
  Administered 2017-11-28: 1 g via INTRAVENOUS
  Filled 2017-11-28: qty 100

## 2017-11-28 MED ORDER — CLONAZEPAM 1 MG PO TABS
1.0000 mg | ORAL_TABLET | Freq: Two times a day (BID) | ORAL | Status: DC | PRN
  Administered 2017-11-28: 1 mg via ORAL
  Filled 2017-11-28: qty 1

## 2017-11-28 MED ORDER — HEPARIN SODIUM (PORCINE) 5000 UNIT/ML IJ SOLN
5000.0000 [IU] | Freq: Three times a day (TID) | INTRAMUSCULAR | Status: DC
  Administered 2017-11-28 – 2017-11-29 (×3): 5000 [IU] via SUBCUTANEOUS
  Filled 2017-11-28 (×3): qty 1

## 2017-11-28 MED ORDER — SODIUM CHLORIDE 0.9 % IV SOLN
INTRAVENOUS | Status: DC
  Administered 2017-11-28: 09:00:00 via INTRAVENOUS

## 2017-11-28 MED ORDER — POTASSIUM CHLORIDE 10 MEQ/100ML IV SOLN
10.0000 meq | INTRAVENOUS | Status: AC
  Administered 2017-11-28: 10 meq via INTRAVENOUS
  Filled 2017-11-28: qty 100

## 2017-11-28 MED ORDER — LACTATED RINGERS IV SOLN
INTRAVENOUS | Status: DC
  Administered 2017-11-28: 02:00:00 via INTRAVENOUS

## 2017-11-28 MED ORDER — POTASSIUM CHLORIDE IN NACL 20-0.9 MEQ/L-% IV SOLN
INTRAVENOUS | Status: DC
  Administered 2017-11-28 – 2017-11-29 (×2): via INTRAVENOUS
  Filled 2017-11-28 (×3): qty 1000

## 2017-11-28 MED ORDER — IOHEXOL 300 MG/ML  SOLN
30.0000 mL | Freq: Once | INTRAMUSCULAR | Status: AC | PRN
  Administered 2017-11-28: 30 mL via INTRAVENOUS

## 2017-11-28 MED ORDER — PIPERACILLIN-TAZOBACTAM 3.375 G IVPB
3.3750 g | Freq: Three times a day (TID) | INTRAVENOUS | Status: DC
  Administered 2017-11-28 – 2017-11-29 (×3): 3.375 g via INTRAVENOUS
  Filled 2017-11-28 (×3): qty 50

## 2017-11-28 MED ORDER — MORPHINE SULFATE (PF) 2 MG/ML IV SOLN
2.0000 mg | INTRAVENOUS | Status: DC | PRN
  Administered 2017-11-28 – 2017-11-29 (×5): 4 mg via INTRAVENOUS
  Filled 2017-11-28: qty 2
  Filled 2017-11-28: qty 1
  Filled 2017-11-28 (×3): qty 2
  Filled 2017-11-28: qty 1

## 2017-11-28 NOTE — Progress Notes (Signed)
Pt admitted to 1432 from the ER. Pt on Contact precautions RN noted GI Panel and C Diff panel are noted to be negative. Consulted with Infection Prevention and Precautions can be discontinued.

## 2017-11-28 NOTE — H&P (Addendum)
TRH H&P   Patient Demographics:    Patrick Logan, is a 47 y.o. male  MRN: [id number]   DOB - 1970/07/23  Admit Date - 11/27/2017  Outpatient Primary MD for the patient is Laurey Morale, MD  Referring MD/NP/PA: Dr Valere Dross  Patient coming from: home  Chief Complaint  Patient presents with  . Nausea  . Emesis  . Diarrhea      HPI:    Patrick Logan  is a 46 y.o. male, with history of GERD, chronic pain, presents with complaints of abdominal pain, nausea and diarrhea, reports symptoms started 1-1/2-day ago, nausea and vomiting, no blood on vomiting, reports some abdominal pain, generalized, he does report diarrhea, brown watery diarrhea, 20 episodes over last day and a half, generalized weakness and fatigue denies any sick contacts, or eating outside, denies any recent antibiotic use. -In ED patient was tachycardic, leukocytosis 19.3, elevated hemoglobin at 18.8, with renal failure with a creatinine of 2.2 CT abdomen and pelvis significant for stool filling colon, charted on IV fluids in ED, as well as started on Zosyn.    Review of systems:    In addition to the HPI above,  Reports fever and chills at home No Headache, No changes with Vision or hearing, No problems swallowing food or Liquids, No Chest pain, Cough or Shortness of Breath, As of abdominal pain, nausea, vomiting, diarrhea, watery, no blood in vomiting or stool . No Blood in stool or Urine, No dysuria, No new skin rashes or bruises, No new joints pains-aches,  No new weakness, tingling, numbness in any extremity, No recent weight gain or loss, No polyuria, polydypsia or polyphagia, No significant Mental Stressors.  A full 10 point Review of Systems was done, except as stated above, all other Review of Systems were negative.   With Past History of the following :    Past Medical History:  Diagnosis Date  .  Allergy   . Anxiety   . Arthritis   . Asthma   . Chronic left shoulder pain   . Depression   . GERD (gastroesophageal reflux disease)   . Ulcer       Past Surgical History:  Procedure Laterality Date  . COLONOSCOPY  2012   per Dr. Benson Norway, clear   . ESOPHAGOGASTRODUODENOSCOPY  2012   per Dr. Benson Norway, normal   . KNEE SURGERY     R 2004  and L 2008  . SHOULDER SURGERY  2008   both   . SHOULDER SURGERY Left   . TOTAL SHOULDER ARTHROPLASTY  03/21/2012   Procedure: TOTAL SHOULDER ARTHROPLASTY;  Surgeon: Augustin Schooling, MD;  Location: [facility];  Service: Orthopedics;  Laterality: Left;  Left Shoudler Hemi Arthroplasty  . TOTAL SHOULDER ARTHROPLASTY Left 09/14/2016   Procedure: Left shoulder revision to Total shoulder arthroplasty;  Surgeon: Netta Cedars, MD;  Location: [facility];  Service: Orthopedics;  Laterality: Left;  . TOTAL SHOULDER REPLACEMENT  03/21/2012   LEFT SHOULDER      Social History:     Social History   Tobacco Use  . Smoking status: Former Smoker    Packs/day: 0.25    Years: 25.00    Pack years: 6.25    Types: Cigarettes  . Smokeless tobacco: Former Systems developer  . Tobacco comment: 3 months ago  Substance Use Topics  . Alcohol use: No    Alcohol/week: 0.0 standard drinks     Lives -home  Mobility -independent     Family History :     Family History  Problem Relation Age of Onset  . Hypertension Mother   . Diabetes Father 11  . Autism Son   . Aneurysm Maternal Grandmother   . Alcohol abuse Maternal Grandfather   . Cirrhosis Maternal Grandfather      Home Medications:   Prior to Admission medications   Medication Sig Start Date End Date Taking? Authorizing Provider  albuterol (PROVENTIL HFA;VENTOLIN HFA) 108 (90 Base) MCG/ACT inhaler Inhale 2 puffs into the lungs every 4 (four) hours as needed for wheezing or shortness of breath. Patient not taking: Reported on 11/27/2017 03/22/15   Laurey Morale, MD  clonazePAM (KLONOPIN) 1 MG tablet TAKE 1 TABLET BY MOUTH  TWICE DAILY AS NEEDED FOR ANXIETY Patient not taking: Reported on 11/27/2017 02/08/16   Laurey Morale, MD  methocarbamol (ROBAXIN) 500 MG tablet Take 1 tablet (500 mg total) by mouth 3 (three) times daily as needed. Patient not taking: Reported on 11/27/2017 09/14/16   Netta Cedars, MD  oxyCODONE (OXY IR/ROXICODONE) 5 MG immediate release tablet Take 1-3 tablets (5-15 mg total) by mouth every 3 (three) hours as needed for breakthrough pain. Patient not taking: Reported on 11/27/2017 09/16/16   Ardeen Jourdain, PA-C     Allergies:     Allergies  Allergen Reactions  . Aspirin Shortness Of Breath    REACTION: bronchospasm  . Dust Mite Extract Shortness Of Breath  . Ibuprofen Shortness Of Breath    REACTION: bronchospasm  . Nsaids Shortness Of Breath    REACTION: bronchospasm  . Tomato Shortness Of Breath     Physical Exam:   Vitals  Blood pressure (!) 141/88, pulse 87, resp. rate 18, height 6' (1.829 m), weight 85.3 kg, SpO2 98 %.   1. General developed male lying in bed in NAD,    2. Normal affect and insight, Not Suicidal or Homicidal, Awake Alert, Oriented X 3.  3. No F.N deficits, ALL C.Nerves Intact, Strength 5/5 all 4 extremities, Sensation intact all 4 extremities, Plantars down going.  4. Ears and Eyes appear Normal, Conjunctivae clear, PERRLA.  Dry oral mucosa  5. Supple Neck, No JVD, No cervical lymphadenopathy appriciated, No Carotid Bruits.  6. Symmetrical Chest wall movement, Good air movement bilaterally, CTAB.  7. RRR, No Gallops, Rubs or Murmurs, No Parasternal Heave.  8. Positive Bowel Sounds, Abdomen Soft, obese, has minimal generalized tenderness to palpation, but no rebound or guarding or rigidity.  9.  No Cyanosis, delayed skin turgor, No Skin Rash or Bruise.  10. Good muscle tone,  joints appear normal , no effusions, Normal ROM.  11. No Palpable Lymph Nodes in Neck or Axillae     Data Review:    CBC Recent Labs  Lab 11/27/17 2312  WBC  19.3*  HGB 18.8*  HCT 54.4*  PLT 491*  MCV 87.5  MCH 30.2  MCHC 34.6  RDW 15.0  LYMPHSABS 3.0  MONOABS 1.2*  EOSABS 0.1  BASOSABS 0.0   ------------------------------------------------------------------------------------------------------------------  Chemistries  Recent Labs  Lab 11/27/17 2312  NA 138  K 3.6  CL 103  CO2 19*  GLUCOSE 134*  BUN 24*  CREATININE 2.24*  CALCIUM 10.4*  AST 26  ALT 29  ALKPHOS 120  BILITOT 0.8   ------------------------------------------------------------------------------------------------------------------ estimated creatinine clearance is 45.2 mL/min (A) (by C-G formula based on SCr of 2.24 mg/dL (H)). ------------------------------------------------------------------------------------------------------------------ No results for input(s): TSH, T4TOTAL, T3FREE, THYROIDAB in the last 72 hours.  Invalid input(s): FREET3  Coagulation profile No results for input(s): INR, PROTIME in the last 168 hours. ------------------------------------------------------------------------------------------------------------------- No results for input(s): DDIMER in the last 72 hours. -------------------------------------------------------------------------------------------------------------------  Cardiac Enzymes Recent Labs  Lab 11/27/17 2312  TROPONINI <0.03   ------------------------------------------------------------------------------------------------------------------ No results found for: BNP   ---------------------------------------------------------------------------------------------------------------  Urinalysis    Component Value Date/Time   COLORURINE YELLOW 03/09/2016 1250   APPEARANCEUR TURBID (A) 03/09/2016 1250   LABSPEC 1.025 03/09/2016 1250   PHURINE 5.0 03/09/2016 1250   GLUCOSEU NEGATIVE 03/09/2016 1250   HGBUR NEGATIVE 03/09/2016 1250   BILIRUBINUR NEGATIVE 03/09/2016 1250   BILIRUBINUR n 03/22/2015 1143    KETONESUR NEGATIVE 03/09/2016 1250   PROTEINUR NEGATIVE 03/09/2016 1250   UROBILINOGEN 0.2 03/22/2015 1143   UROBILINOGEN 0.2 12/23/2007 1919   NITRITE NEGATIVE 03/09/2016 1250   LEUKOCYTESUR NEGATIVE 03/09/2016 1250    ----------------------------------------------------------------------------------------------------------------   Imaging Results:    Ct Abdomen Pelvis Wo Contrast  Result Date: 11/28/2017 CLINICAL DATA:  Abdominal distension. Nausea, vomiting and diarrhea for 2 days. EXAM: CT ABDOMEN AND PELVIS WITHOUT CONTRAST TECHNIQUE: Multidetector CT imaging of the abdomen and pelvis was performed following the standard protocol without IV contrast. COMPARISON:  CT 03/09/2016, chest abdomen pelvis CT 03/07/2009 FINDINGS: Lower chest: 6 mm left lower lobe pulmonary nodule image 21 series 3. This is unchanged from abdominal CT 03/07/2009 and considered benign. No pleural fluid or consolidation. Hepatobiliary: Liver is enlarged spanning 21 cm cranial caudal. Diffusely decreased density consistent with steatosis. No discrete lesion on noncontrast exam. Mild gallbladder distention without calcified gallstone or pericholecystic inflammation. No biliary dilatation. Pancreas: No ductal dilatation or inflammation. Spleen: Normal in size without focal abnormality. Adrenals/Urinary Tract: No adrenal nodule. No hydronephrosis or perinephric edema. Punctate no ureteral calculi. Urinary bladder is partially distended without wall thickening. Nonobstructing stone in the upper right kidney. Stomach/Bowel: Small hiatal hernia. Stomach is nondistended. Small duodenal diverticulum. Administered enteric contrast reaches the mid small bowel. No bowel obstruction. No small bowel wall thickening or inflammation. Normal appendix. Liquid stool throughout the colon with fluid levels. No colonic wall thickening or inflammatory change. Vascular/Lymphatic: Mild aorta bi-iliac atherosclerosis. No aneurysm. No enlarged  abdominal or pelvic lymph nodes. Reproductive: Prostate is unremarkable. Other: No free air or free fluid. No intra-abdominal abscess. Fat in both inguinal canals, left greater than right. Tiny fat containing umbilical hernia. Musculoskeletal: There are no acute or suspicious osseous abnormalities. Degenerative disc disease and facet arthropathy at L5-S1. IMPRESSION: 1. Liquid stool throughout the colon consistent with diarrheal process. No bowel inflammation or wall thickening. 2. Incidental findings of hepatic steatosis and punctate nonobstructing right renal stone. 3.  Aortic Atherosclerosis (ICD10-I70.0). Electronically Signed   By: Keith Rake M.D.   On: 11/28/2017 03:44    My personal review of EKG: Rhythm sinus tachycardia, Rate  106 /min, QTc 489 , with incomplete right bundle branch block   Assessment & Plan:    Active Problems:   ARF (  acute renal failure) (HCC)   Sepsis (HCC)   Dehydration   Infectious diarrhea   Sepsis secondary to infective diarrhea -Presents with leukocytosis, tachycardia, elevated lactic acid, sepsis criteria met on admission -Secondary to infectious diarrhea, as he is having diarrhea, abdominal pain, nausea and vomiting, given his prolonged QTC will avoid Cipro and Flagyl currently, will continue with IV Zosyn for empiric coverage, continue with IV fluids, keep n.p.o., PRN pain and nausea meds, follow on GI panel, C. difficile PCR already negative. -Repeat labs in a.m..  AKI/dehydration -Baseline creatinine 0.85, it is 2.24 today, this is in the setting of prerenal renal failure, with volume depletion in the setting of diarrhea, nausea and vomiting and poor oral intake, avoid nephrotoxic medication, continue with IV fluid.  Repeat BMP in a.m. -Patient with elevated hemoglobin at 18.8 indicative of dehydration, continue with IV fluid resuscitation.  Prolonged QTC -Mildly prolonged at 489, will monitor in telemetry, I will give mag and potassium in ED, and  monitor electrolytes closely  hepatic steatosis -Evident on CT abdomen pelvis, LFTs are stable, will counsel about weight loss with more stable   DVT Prophylaxis Heparin -  SCDs   AM Labs Ordered, also please review Full Orders  Family Communication: Admission, patients condition and plan of care including tests being ordered have been discussed with the patient who indicate understanding and agree with the plan and Code Status.  Code Status full  Likely DC to  home  Condition GUARDED    Consults called: None  Admission status: inpatient  Time spent in minutes : 60 MINUTES   Phillips Climes M.D on 11/28/2017 at 9:12 AM  Between 7am to 7pm - Pager - [phone]. After 7pm go to [email or web address] - password [facility]  [facility] - Office  [phone]

## 2017-11-28 NOTE — Progress Notes (Signed)
Pharmacy Antibiotic Note  Eeshan Verbrugge is a 47 y.o. male admitted on 11/27/2017 with intra-abdominal infection.  Pharmacy has been consulted for Zosyn dosing.   11/28/2017:  No fever reported/recorded  WBC elevated (19.3), LA WNL  Scr acutely elevated likely due to dehydration (baseline ~0.85), CrCl>3ml/min  Plan: Zosyn 3.375g IV q8h (4 hour infusion).  No dose adjustments anticipated.  Pharmacy will sign off & monitor peripherally via electronic surveillance software.  Height: 6' (182.9 cm) Weight: 188 lb (85.3 kg) IBW/kg (Calculated) : 77.6  No data recorded.  Recent Labs  Lab 11/27/17 2312 11/27/17 2320 11/28/17 0111  WBC 19.3*  --   --   CREATININE 2.24*  --   --   LATICACIDVEN  --  1.98* 1.27    Estimated Creatinine Clearance: 45.2 mL/min (A) (by C-G formula based on SCr of 2.24 mg/dL (H)).    Allergies  Allergen Reactions  . Aspirin Shortness Of Breath    REACTION: bronchospasm  . Dust Mite Extract Shortness Of Breath  . Ibuprofen Shortness Of Breath    REACTION: bronchospasm  . Nsaids Shortness Of Breath    REACTION: bronchospasm  . Tomato Shortness Of Breath    Antimicrobials this admission: 10/3 Zosyn >>   Dose adjustments this admission:  Microbiology results: 10/2 BCx: NGTD 10/3 Cdiff PCR: negative 10/3 GI PCR: IP  Thank you for allowing pharmacy to be a part of this patient's care.  Elson Clan 11/28/2017 8:38 AM

## 2017-11-28 NOTE — ED Provider Notes (Signed)
[facility]-EMERGENCY DEPT Provider Note   CSN: [id number] Arrival date & time: 11/27/17  2147     History   Chief Complaint Chief Complaint  Patient presents with  . Nausea  . Emesis  . Diarrhea    HPI Patrick Logan is a 47 y.o. male.  HPI  Patient is a 47 year old male with a history of depression with anxiety, panic disorder, and GERD presenting for nausea, vomiting, and diarrhea for the past 24 hours.  Patient reports it began suddenly yesterday and he believes he has had greater than 40 episodes of diarrhea in this 24-hour interval.  Patient reports that the diarrhea is nonbloody, and is a watery and tan color.  Patient also reports several times an hour of nonbilious and nonbloody emesis.  Patient denies any fevers or chills.  Patient does report he has had generalized abdominal pain, worse in the epigastric region.  Patient denies any recent travel, sick contacts, tick bites, or recent ingestion of undercooked food.  No recent antibiotics or medication changes.  Denies any abdominal surgical history.  Patient was noted by EMS to have a red rash.  Patient reports that he frequently gets "skin bumps" on his lower extremities, and is unsure if the 1 cc are new.  Patient denies noting any rash on himself. Past Medical History:  Diagnosis Date  . Allergy   . Anxiety   . Arthritis   . Asthma   . Chronic left shoulder pain   . Depression   . GERD (gastroesophageal reflux disease)   . Ulcer     Patient Active Problem List   Diagnosis Date Noted  . 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 Acute respiratory failure (HCC) 03/09/2016  . Environmental allergies 06/16/2015  . 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 CHEST WALL PAIN, ACUTE 04/18/2010  . SHOULDER PAIN, LEFT 04/06/2010  . HYPERGLYCEMIA 01/05/2010  . URI 01/03/2010  . Essential hypertension 01/03/2010  . PANIC DISORDER 11/29/2009  . Depression with anxiety 11/29/2009  . INSOMNIA  11/29/2009  . BACK STRAIN, ACUTE 11/29/2009  . COMMON MIGRAINE 01/07/2007  . GERD 01/03/2007  . THROAT PAIN 01/03/2007  . TACHYCARDIA 12/19/2006  . LUMBAR STRAIN 12/19/2006    Past Surgical History:  Procedure Laterality Date  . COLONOSCOPY  2012   per Dr. Elnoria Howard, clear   . ESOPHAGOGASTRODUODENOSCOPY  2012   per Dr. Elnoria Howard, normal   . KNEE SURGERY     R 2004  and L 2008  . SHOULDER SURGERY  2008   both   . SHOULDER SURGERY Left   . TOTAL SHOULDER ARTHROPLASTY  03/21/2012   Procedure: TOTAL SHOULDER ARTHROPLASTY;  Surgeon: Verlee Rossetti, MD;  Location: [facility] OR;  Service: Orthopedics;  Laterality: Left;  Left Shoudler Hemi Arthroplasty  . TOTAL SHOULDER ARTHROPLASTY Left 09/14/2016   Procedure: Left shoulder revision to Total shoulder arthroplasty;  Surgeon: Beverely Low, MD;  Location: [facility] OR;  Service: Orthopedics;  Laterality: Left;  . TOTAL SHOULDER REPLACEMENT  03/21/2012   LEFT SHOULDER        Home Medications    Prior to Admission medications   Medication Sig Start Date End Date Taking? Authorizing Provider  albuterol (PROVENTIL HFA;VENTOLIN HFA) 108 (90 Base) MCG/ACT inhaler Inhale 2 puffs into the lungs every 4 (four) hours as needed for wheezing or shortness of breath. Patient not taking: Reported on 11/27/2017 03/22/15   Nelwyn Salisbury, MD  clonazePAM (KLONOPIN) 1 MG tablet TAKE 1 TABLET BY MOUTH TWICE  DAILY AS NEEDED FOR ANXIETY Patient not taking: Reported on 11/27/2017 02/08/16   Nelwyn Salisbury, MD  methocarbamol (ROBAXIN) 500 MG tablet Take 1 tablet (500 mg total) by mouth 3 (three) times daily as needed. Patient not taking: Reported on 11/27/2017 09/14/16   Beverely Low, MD  oxyCODONE (OXY IR/ROXICODONE) 5 MG immediate release tablet Take 1-3 tablets (5-15 mg total) by mouth every 3 (three) hours as needed for breakthrough pain. Patient not taking: Reported on 11/27/2017 09/16/16   Dimitri Ped, PA-C    Family History Family History  Problem Relation Age of Onset    . Hypertension Mother   . Diabetes Father 2  . Autism Son   . Aneurysm Maternal Grandmother   . Alcohol abuse Maternal Grandfather   . Cirrhosis Maternal Grandfather     Social History Social History   Tobacco Use  . Smoking status: Former Smoker    Packs/day: 0.25    Years: 25.00    Pack years: 6.25    Types: Cigarettes  . Smokeless tobacco: Former Neurosurgeon  . Tobacco comment: 3 months ago  Substance Use Topics  . Alcohol use: No    Alcohol/week: 0.0 standard drinks  . Drug use: No     Allergies   Aspirin; Dust mite extract; Ibuprofen; Nsaids; and Tomato   Review of Systems Review of Systems  Constitutional: Negative for chills and fever.  HENT: Negative for congestion and rhinorrhea.   Respiratory: Negative for chest tightness and shortness of breath.   Cardiovascular: Negative for chest pain.  Gastrointestinal: Positive for abdominal pain, diarrhea, nausea and vomiting.  Genitourinary: Negative for dysuria.  Skin: Positive for rash.  All other systems reviewed and are negative.    Physical Exam Updated Vital Signs BP 128/83 (BP Location: Left Arm)   Pulse 100   Resp 18   SpO2 99%   Physical Exam  Constitutional: He appears well-developed and well-nourished.  Lying in bed, appears uncomfortable.  HENT:  Head: Normocephalic and atraumatic.  Mouth/Throat: Oropharynx is clear and moist.  Eyes: Pupils are equal, round, and reactive to light. Conjunctivae and EOM are normal.  Neck: Normal range of motion. Neck supple.  Cardiovascular: Regular rhythm, S1 normal and S2 normal.  No murmur heard. Tachycardic.  Pulmonary/Chest: Effort normal and breath sounds normal. He has no wheezes. He has no rales.  Abdominal: Soft. He exhibits no distension. There is tenderness. There is no guarding.  Patient with generalized tenderness to palpation of the abdomen without guarding or rebound.  There is no focal right lower left lower quadrant tenderness.  Musculoskeletal:  Normal range of motion. He exhibits no edema or deformity.  Neurological: He is alert.  Cranial nerves grossly intact. Patient moves extremities symmetrically and with good coordination.  Skin: Skin is warm and dry. Rash noted. No erythema.  Patient has reticular appearing rash of abdomen bilateral upper extremities.  Patient has maculopapular rash bilateral lower extremity's.  There are no lesions of the palms or soles.  Psychiatric: He has a normal mood and affect. His behavior is normal. Judgment and thought content normal.  Nursing note and vitals reviewed.    ED Treatments / Results  Labs (all labs ordered are listed, but only abnormal results are displayed) Labs Reviewed  COMPREHENSIVE METABOLIC PANEL - Abnormal; Notable for the following components:      Result Value   CO2 19 (*)    Glucose, Bld 134 (*)    BUN 24 (*)    Creatinine, Ser 2.24 (*)  Calcium 10.4 (*)    Total Protein 9.9 (*)    Albumin 5.2 (*)    GFR calc non Af Amer 33 (*)    GFR calc Af Amer 39 (*)    Anion gap 16 (*)    All other components within normal limits  CBC WITH DIFFERENTIAL/PLATELET - Abnormal; Notable for the following components:   WBC 19.3 (*)    RBC 6.22 (*)    Hemoglobin 18.8 (*)    HCT 54.4 (*)    Platelets 491 (*)    Neutro Abs 15.1 (*)    Monocytes Absolute 1.2 (*)    All other components within normal limits  I-STAT CG4 LACTIC ACID, ED - Abnormal; Notable for the following components:   Lactic Acid, Venous 1.98 (*)    All other components within normal limits  CULTURE, BLOOD (ROUTINE X 2)  CULTURE, BLOOD (ROUTINE X 2)  GASTROINTESTINAL PANEL BY PCR, STOOL (REPLACES STOOL CULTURE)  C DIFFICILE QUICK SCREEN W PCR REFLEX  TROPONIN I  I-STAT CG4 LACTIC ACID, ED    EKG EKG Interpretation  Date/Time:  Wednesday November 27 2017 23:39:00 EDT Ventricular Rate:  106 PR Interval:    QRS Duration: 112 QT Interval:  368 QTC Calculation: 489 R Axis:   57 Text Interpretation:   Sinus tachycardia LAE, consider biatrial enlargement Incomplete right bundle branch block Confirmed by Nicanor Alcon, April ([id number]) on 11/27/2017 11:42:46 PM   Radiology Ct Abdomen Pelvis Wo Contrast  Result Date: 11/28/2017 CLINICAL DATA:  Abdominal distension. Nausea, vomiting and diarrhea for 2 days. EXAM: CT ABDOMEN AND PELVIS WITHOUT CONTRAST TECHNIQUE: Multidetector CT imaging of the abdomen and pelvis was performed following the standard protocol without IV contrast. COMPARISON:  CT 03/09/2016, chest abdomen pelvis CT 03/07/2009 FINDINGS: Lower chest: 6 mm left lower lobe pulmonary nodule image 21 series 3. This is unchanged from abdominal CT 03/07/2009 and considered benign. No pleural fluid or consolidation. Hepatobiliary: Liver is enlarged spanning 21 cm cranial caudal. Diffusely decreased density consistent with steatosis. No discrete lesion on noncontrast exam. Mild gallbladder distention without calcified gallstone or pericholecystic inflammation. No biliary dilatation. Pancreas: No ductal dilatation or inflammation. Spleen: Normal in size without focal abnormality. Adrenals/Urinary Tract: No adrenal nodule. No hydronephrosis or perinephric edema. Punctate no ureteral calculi. Urinary bladder is partially distended without wall thickening. Nonobstructing stone in the upper right kidney. Stomach/Bowel: Small hiatal hernia. Stomach is nondistended. Small duodenal diverticulum. Administered enteric contrast reaches the mid small bowel. No bowel obstruction. No small bowel wall thickening or inflammation. Normal appendix. Liquid stool throughout the colon with fluid levels. No colonic wall thickening or inflammatory change. Vascular/Lymphatic: Mild aorta bi-iliac atherosclerosis. No aneurysm. No enlarged abdominal or pelvic lymph nodes. Reproductive: Prostate is unremarkable. Other: No free air or free fluid. No intra-abdominal abscess. Fat in both inguinal canals, left greater than right. Tiny fat  containing umbilical hernia. Musculoskeletal: There are no acute or suspicious osseous abnormalities. Degenerative disc disease and facet arthropathy at L5-S1. IMPRESSION: 1. Liquid stool throughout the colon consistent with diarrheal process. No bowel inflammation or wall thickening. 2. Incidental findings of hepatic steatosis and punctate nonobstructing right renal stone. 3.  Aortic Atherosclerosis (ICD10-I70.0). Electronically Signed   By: Narda Rutherford M.D.   On: 11/28/2017 03:44    Procedures Procedures (including critical care time)  Medications Ordered in ED Medications  lactated ringers infusion ( Intravenous New Bag/Given 11/28/17 0215)  lactated ringers bolus 1,000 mL (0 mLs Intravenous Stopped 11/28/17 0100)  lactated ringers bolus  1,000 mL (0 mLs Intravenous Stopped 11/28/17 0100)  morphine 4 MG/ML injection 4 mg (4 mg Intravenous Given 11/27/17 2310)  iohexol (OMNIPAQUE) 300 MG/ML solution 30 mL (30 mLs Intravenous Contrast Given 11/28/17 0100)  morphine 4 MG/ML injection 4 mg (4 mg Intravenous Given 11/28/17 0237)  ondansetron (ZOFRAN) injection 4 mg (4 mg Intravenous Given 11/28/17 0236)     Initial Impression / Assessment and Plan / ED Course  I have reviewed the triage vital signs and the nursing notes.  Pertinent labs & imaging results that were available during my care of the patient were reviewed by me and considered in my medical decision making (see chart for details).  Clinical Course as of Nov 28 604  Thu Nov 28, 2017  2917 47 year old male with nausea vomiting diarrhea since yesterday.  He is got a soft abdomen with some mild tenderness.  His lab work is significant for hemoconcentration and a new AKI.  He still pending a CT but will need to be admitted for continued hydration.   [MB]  0005 Patient has creatinine greater than 2.  Received call from CT.  Will change to oral contrast.  Creatinine(!): 2.24 [AM]  0331 Hemoglobin(!): 18.8 [AM]  0331 Suggestive of  hemoconcentration.  HCT(!): 54.4 [AM]  0332 Creatinine(!): 2.24 [AM]  0333 Tachycardia improving.   Pulse Rate: 100 [AM]  0334 QT appears prolonged. Will withhold any further zofran.   EKG 12-Lead [AM]  [phone] Spoke with Dr. Toniann Fail who will admit patient.  I appreciate his involvement in the care of this patient.   [AM]    Clinical Course User Index [AM] Elisha Ponder, PA-C [MB] Terrilee Files, MD    Patient is uncomfortable appearing with reported profound volume depletion over the past 24 hours.  Unclear etiology, but GI panel and C. difficile are pending.    Patient's rash is not clearly acute.  Patient's lower extremity papules may be scabies, patient was empirically placed on contact precautions, although he reports long history of similar lesions. Regarding the reticular rash, is blanching in nature.  Patient is afebrile.  Do not suspect cutaneous manifestations of systemic disease.  No recent tick exposures.  No involvement of palms or soles.  Patient does have AKI.  See below for trend of creatinine.  Lab work otherwise showing hemoconcentration.  Lab Results  Component Value Date   CREATININE 2.24 (H) 11/27/2017   CREATININE 0.85 09/15/2016   CREATININE 1.08 09/12/2016   Patient does have prolonged QT on EKG of 489.  Will withhold any further doses of QT prolonging antiemetics.   CT without acute findings.  No bowel wall thickening.  No toxic megacolon.  Will admit.  Final Clinical Impressions(s) / ED Diagnoses   Final diagnoses:  Non-intractable vomiting with nausea, unspecified vomiting type  Diarrhea, unspecified type  Prolonged Q-T interval on ECG  AKI (acute kidney injury) [facility])    ED Discharge Orders    None       Delia Chimes 11/28/17 [id number]    Terrilee Files, MD 11/28/17 [phone]

## 2017-11-28 NOTE — ED Notes (Signed)
ED TO INPATIENT HANDOFF REPORT  Name/Age/Gender Patrick Logan 47 y.o. male  Code Status Code Status History    Date Active Date Inactive Code Status Order ID Comments User Context   09/14/2016 1231 09/16/2016 1327 Full Code [id number]  Netta Cedars, MD Inpatient   03/10/2016 0315 03/11/2016 1432 Full Code [id number]  Collene Gobble, MD Inpatient   03/21/2012 1417 03/24/2012 1535 Full Code [id number]  Ron Parker, RN Inpatient      Home/SNF/Other Home  Chief Complaint NVD  Level of Care/Admitting Diagnosis ED Disposition    ED Disposition Condition [facility]: [facility] [[id number]]  Level of Care: Telemetry [5]  Admit to tele based on following criteria: Monitor QTC interval  Diagnosis: Infective colitis [767341]  Admitting Physician: Manfred Shirts  Attending Physician: Waldron Labs, DAWOOD S [[id number]]  Estimated length of stay: past midnight tomorrow  Certification:: I certify this patient will need inpatient services for at least 2 midnights  PT Class (Do Not Modify): Inpatient [101]  PT Acc Code (Do Not Modify): Private [1]       Medical History Past Medical History:  Diagnosis Date  . Allergy   . Anxiety   . Arthritis   . Asthma   . Chronic left shoulder pain   . Depression   . GERD (gastroesophageal reflux disease)   . Ulcer     Allergies Allergies  Allergen Reactions  . Aspirin Shortness Of Breath    REACTION: bronchospasm  . Dust Mite Extract Shortness Of Breath  . Ibuprofen Shortness Of Breath    REACTION: bronchospasm  . Nsaids Shortness Of Breath    REACTION: bronchospasm  . Tomato Shortness Of Breath    IV Location/Drains/Wounds Patient Lines/Drains/Airways Status   Active Line/Drains/Airways    Name:   Placement date:   Placement time:   Site:   Days:   Peripheral IV 11/27/17 Right Antecubital   11/27/17    2310    Antecubital   1   Peripheral IV 11/27/17 Left Antecubital   11/27/17     2325    Antecubital   1   Incision (Closed) 09/14/16 Shoulder Left   09/14/16    0927     440          Labs/Imaging Results for orders placed or performed during the hospital encounter of 11/27/17 (from the past 48 hour(s))  Comprehensive metabolic panel     Status: Abnormal   Collection Time: 11/27/17 11:12 PM  Result Value Ref Range   Sodium 138 135 - 145 mmol/L   Potassium 3.6 3.5 - 5.1 mmol/L   Chloride 103 98 - 111 mmol/L   CO2 19 (L) 22 - 32 mmol/L   Glucose, Bld 134 (H) 70 - 99 mg/dL   BUN 24 (H) 6 - 20 mg/dL   Creatinine, Ser 2.24 (H) 0.61 - 1.24 mg/dL   Calcium 10.4 (H) 8.9 - 10.3 mg/dL   Total Protein 9.9 (H) 6.5 - 8.1 g/dL   Albumin 5.2 (H) 3.5 - 5.0 g/dL   AST 26 15 - 41 U/L   ALT 29 0 - 44 U/L   Alkaline Phosphatase 120 38 - 126 U/L   Total Bilirubin 0.8 0.3 - 1.2 mg/dL   GFR calc non Af Amer 33 (L) >60 mL/min   GFR calc Af Amer 39 (L) >60 mL/min    Comment: (NOTE) The eGFR has been calculated using the CKD EPI equation. This calculation has  not been validated in all clinical situations. eGFR's persistently <60 mL/min signify possible Chronic Kidney Disease.    Anion gap 16 (H) 5 - 15    Comment: Performed at [facility], [place] [address], [place], [place] [address]  CBC with Differential     Status: Abnormal   Collection Time: 11/27/17 11:12 PM  Result Value Ref Range   WBC 19.3 (H) 4.0 - 10.5 K/uL   RBC 6.22 (H) 4.22 - 5.81 MIL/uL   Hemoglobin 18.8 (H) 13.0 - 17.0 g/dL   HCT 54.4 (H) 39.0 - 52.0 %   MCV 87.5 78.0 - 100.0 fL   MCH 30.2 26.0 - 34.0 pg   MCHC 34.6 30.0 - 36.0 g/dL   RDW 15.0 11.5 - 15.5 %   Platelets 491 (H) 150 - 400 K/uL   Neutrophils Relative % 79 %   Neutro Abs 15.1 (H) 1.7 - 7.7 K/uL   Lymphocytes Relative 15 %   Lymphs Abs 3.0 0.7 - 4.0 K/uL   Monocytes Relative 6 %   Monocytes Absolute 1.2 (H) 0.1 - 1.0 K/uL   Eosinophils Relative 0 %   Eosinophils Absolute 0.1 0.0 - 0.7 K/uL   Basophils Relative 0 %    Basophils Absolute 0.0 0.0 - 0.1 K/uL    Comment: Performed at [facility], [place] [address], [place], [place] [address]  Troponin I     Status: None   Collection Time: 11/27/17 11:12 PM  Result Value Ref Range   Troponin I <0.03 <0.03 ng/mL    Comment: Performed at [facility], [place] [address] [place], [place] [address]  Culture, blood (routine x 2)     Status: None (Preliminary result)   Collection Time: 11/27/17 11:13 PM  Result Value Ref Range   Specimen Description      BLOOD RIGHT ANTECUBITAL Performed at [facility] [address], [place], [place] [address]    Special Requests      BOTTLES DRAWN AEROBIC AND ANAEROBIC Blood Culture adequate volume Performed at [facility] [address], [place], [place] [address]    Culture      NO GROWTH < 12 HOURS Performed at [facility] [address], [place], [place] [address]    Report Status PENDING   I-Stat CG4 Lactic Acid, ED     Status: Abnormal   Collection Time: 11/27/17 11:20 PM  Result Value Ref Range   Lactic Acid, Venous 1.98 (H) 0.5 - 1.9 mmol/L  Culture, blood (routine x 2)     Status: None (Preliminary result)   Collection Time: 11/27/17 11:25 PM  Result Value Ref Range   Specimen Description      BLOOD LEFT ANTECUBITAL Performed at [facility], [place] [address], [place], [place] [address]    Special Requests      BOTTLES DRAWN AEROBIC AND ANAEROBIC Blood Culture adequate volume Performed at [facility] [address] [place], [place] [address]    Culture      NO GROWTH < 12 HOURS Performed at [facility] [address], [place], [place] [address]    Report Status PENDING   I-Stat CG4 Lactic Acid, ED     Status: None   Collection Time: 11/28/17  1:11 AM  Result Value Ref Range   Lactic Acid, Venous 1.27 0.5 - 1.9 mmol/L  Gastrointestinal Panel by PCR , Stool     Status: None  Collection Time: 11/28/17  2:16 AM  Result Value Ref Range   Campylobacter species NOT DETECTED NOT DETECTED   Plesimonas shigelloides NOT DETECTED NOT DETECTED   Salmonella species NOT DETECTED NOT DETECTED   Yersinia enterocolitica NOT DETECTED NOT DETECTED   Vibrio species NOT DETECTED NOT DETECTED   Vibrio cholerae NOT DETECTED NOT DETECTED   Enteroaggregative E coli (EAEC) NOT DETECTED NOT DETECTED   Enteropathogenic E coli (EPEC) NOT DETECTED NOT DETECTED   Enterotoxigenic E coli (ETEC) NOT DETECTED NOT DETECTED   Shiga like toxin producing E coli (STEC) NOT DETECTED NOT DETECTED   Shigella/Enteroinvasive E coli (EIEC) NOT DETECTED NOT DETECTED   Cryptosporidium NOT DETECTED NOT DETECTED   Cyclospora cayetanensis NOT DETECTED NOT DETECTED   Entamoeba histolytica NOT DETECTED NOT DETECTED   Giardia lamblia NOT DETECTED NOT DETECTED   Adenovirus F40/41 NOT DETECTED NOT DETECTED   Astrovirus NOT DETECTED NOT DETECTED   Norovirus GI/GII NOT DETECTED NOT DETECTED   Rotavirus A NOT DETECTED NOT DETECTED   Sapovirus (I, II, IV, and V) NOT DETECTED NOT DETECTED    Comment: Performed at [facility], [place] [place], [place] [address]  C difficile quick scan w PCR reflex     Status: None   Collection Time: 11/28/17  2:16 AM  Result Value Ref Range   C Diff antigen NEGATIVE NEGATIVE   C Diff toxin NEGATIVE NEGATIVE   C Diff interpretation No C. difficile detected.     Comment: Performed at [facility], [place] [address] [place], [place] [address]   Ct Abdomen Pelvis Wo Contrast  Result Date: 11/28/2017 CLINICAL DATA:  Abdominal distension. Nausea, vomiting and diarrhea for 2 days. EXAM: CT ABDOMEN AND PELVIS WITHOUT CONTRAST TECHNIQUE: Multidetector CT imaging of the abdomen and pelvis was performed following the standard protocol without IV contrast. COMPARISON:  CT 03/09/2016, chest abdomen pelvis CT 03/07/2009 FINDINGS: Lower chest: 6 mm left  lower lobe pulmonary nodule image 21 series 3. This is unchanged from abdominal CT 03/07/2009 and considered benign. No pleural fluid or consolidation. Hepatobiliary: Liver is enlarged spanning 21 cm cranial caudal. Diffusely decreased density consistent with steatosis. No discrete lesion on noncontrast exam. Mild gallbladder distention without calcified gallstone or pericholecystic inflammation. No biliary dilatation. Pancreas: No ductal dilatation or inflammation. Spleen: Normal in size without focal abnormality. Adrenals/Urinary Tract: No adrenal nodule. No hydronephrosis or perinephric edema. Punctate no ureteral calculi. Urinary bladder is partially distended without wall thickening. Nonobstructing stone in the upper right kidney. Stomach/Bowel: Small hiatal hernia. Stomach is nondistended. Small duodenal diverticulum. Administered enteric contrast reaches the mid small bowel. No bowel obstruction. No small bowel wall thickening or inflammation. Normal appendix. Liquid stool throughout the colon with fluid levels. No colonic wall thickening or inflammatory change. Vascular/Lymphatic: Mild aorta bi-iliac atherosclerosis. No aneurysm. No enlarged abdominal or pelvic lymph nodes. Reproductive: Prostate is unremarkable. Other: No free air or free fluid. No intra-abdominal abscess. Fat in both inguinal canals, left greater than right. Tiny fat containing umbilical hernia. Musculoskeletal: There are no acute or suspicious osseous abnormalities. Degenerative disc disease and facet arthropathy at L5-S1. IMPRESSION: 1. Liquid stool throughout the colon consistent with diarrheal process. No bowel inflammation or wall thickening. 2. Incidental findings of hepatic steatosis and punctate nonobstructing right renal stone. 3.  Aortic Atherosclerosis (ICD10-I70.0). Electronically Signed   By: Keith Rake M.D.   On: 11/28/2017 03:44    Pending Labs Unresulted Labs (From admission, onward)    Start  Ordered    11/29/17 0500  Magnesium  Tomorrow morning,   R     11/28/17 0835   11/28/17 0339  Urinalysis, Routine w reflex microscopic  Once,   R     11/28/17 0338   Signed and Held  HIV antibody (Routine Testing)  Once,   R     Signed and Held   Signed and Held  CBC  (heparin)  Once,   R    Comments:  Baseline for heparin therapy IF NOT ALREADY DRAWN.  Notify MD if PLT < 100 K.    Signed and Held   Signed and Held  Creatinine, serum  (heparin)  Once,   R    Comments:  Baseline for heparin therapy IF NOT ALREADY DRAWN.    Signed and Held   Signed and Held  Comprehensive metabolic panel  Tomorrow morning,   R     Signed and Held   Signed and Held  CBC  Tomorrow morning,   R     Signed and Held          Vitals/Pain Today's Vitals   11/28/17 0723 11/28/17 0807 11/28/17 0808 11/28/17 0910  BP:    (!) 141/88  Pulse:    87  Resp:    18  SpO2:  100%  98%  Weight:   85.3 kg   Height:   6' (1.829 m)   PainSc: Asleep 0-No pain      Isolation Precautions Enteric precautions (UV disinfection)  Medications Medications  lactated ringers infusion ( Intravenous Stopped 11/28/17 0948)  0.9 %  sodium chloride infusion ( Intravenous New Bag/Given 11/28/17 0922)  potassium chloride 10 mEq in 100 mL IVPB (0 mEq Intravenous Stopped 11/28/17 1154)  piperacillin-tazobactam (ZOSYN) IVPB 3.375 g (has no administration in time range)  morphine 2 MG/ML injection 2-4 mg (4 mg Intravenous Given 11/28/17 1013)  lactated ringers bolus 1,000 mL (0 mLs Intravenous Stopped 11/28/17 0100)  lactated ringers bolus 1,000 mL (0 mLs Intravenous Stopped 11/28/17 0100)  morphine 4 MG/ML injection 4 mg (4 mg Intravenous Given 11/27/17 2310)  iohexol (OMNIPAQUE) 300 MG/ML solution 30 mL (30 mLs Intravenous Contrast Given 11/28/17 0100)  morphine 4 MG/ML injection 4 mg (4 mg Intravenous Given 11/28/17 0237)  ondansetron (ZOFRAN) injection 4 mg (4 mg Intravenous Given 11/28/17 0236)  magnesium sulfate IVPB 1 g 100 mL (0 g  Intravenous Stopped 11/28/17 1024)  piperacillin-tazobactam (ZOSYN) IVPB 3.375 g (0 g Intravenous Stopped 11/28/17 0954)  ondansetron (ZOFRAN) injection 4 mg (4 mg Intravenous Given 11/28/17 1013)    Mobility walks

## 2017-11-29 DIAGNOSIS — R112 Nausea with vomiting, unspecified: Secondary | ICD-10-CM

## 2017-11-29 DIAGNOSIS — R1110 Vomiting, unspecified: Secondary | ICD-10-CM

## 2017-11-29 DIAGNOSIS — N179 Acute kidney failure, unspecified: Secondary | ICD-10-CM

## 2017-11-29 LAB — COMPREHENSIVE METABOLIC PANEL
ALK PHOS: 79 U/L (ref 38–126)
ALT: 21 U/L (ref 0–44)
ANION GAP: 10 (ref 5–15)
AST: 18 U/L (ref 15–41)
Albumin: 3.3 g/dL — ABNORMAL LOW (ref 3.5–5.0)
BILIRUBIN TOTAL: 0.6 mg/dL (ref 0.3–1.2)
BUN: 23 mg/dL — AB (ref 6–20)
CO2: 22 mmol/L (ref 22–32)
CREATININE: 1.05 mg/dL (ref 0.61–1.24)
Calcium: 8.5 mg/dL — ABNORMAL LOW (ref 8.9–10.3)
Chloride: 108 mmol/L (ref 98–111)
GFR calc Af Amer: >60 mL/min (ref >60)
GFR calc non Af Amer: >60 mL/min (ref >60)
GLUCOSE: 99 mg/dL (ref 70–99)
Potassium: 3.9 mmol/L (ref 3.5–5.1)
SODIUM: 140 mmol/L (ref 135–145)
Total Protein: 6.6 g/dL (ref 6.5–8.1)

## 2017-11-29 LAB — HIV ANTIBODY (ROUTINE TESTING W REFLEX): HIV Screen 4th Generation wRfx: NONREACTIVE

## 2017-11-29 LAB — CBC
HCT: 42.9 % (ref 39.0–52.0)
HEMOGLOBIN: 13.9 g/dL (ref 13.0–17.0)
MCH: 28.5 pg (ref 26.0–34.0)
MCHC: 32.4 g/dL (ref 30.0–36.0)
MCV: 88.1 fL (ref 78.0–100.0)
Platelets: 378 10*3/uL (ref 150–400)
RBC: 4.87 MIL/uL (ref 4.22–5.81)
RDW: 14.7 % (ref 11.5–15.5)
WBC: 8 10*3/uL (ref 4.0–10.5)

## 2017-11-29 LAB — MAGNESIUM: Magnesium: 2 mg/dL (ref 1.7–2.4)

## 2017-11-29 MED ORDER — OXYCODONE HCL 5 MG PO TABS
5.0000 mg | ORAL_TABLET | ORAL | Status: DC | PRN
  Administered 2017-11-29 (×2): 5 mg via ORAL
  Filled 2017-11-29 (×2): qty 1

## 2017-11-29 MED ORDER — ACETAMINOPHEN 500 MG PO TABS: 1000.0000 mg | ORAL_TABLET | Freq: Four times a day (QID) | ORAL | 0 refills | Status: AC | PRN

## 2017-11-29 MED ORDER — PANTOPRAZOLE SODIUM 40 MG PO TBEC
40.0000 mg | DELAYED_RELEASE_TABLET | Freq: Every day | ORAL | Status: DC
  Administered 2017-11-29: 40 mg via ORAL
  Filled 2017-11-29: qty 1

## 2017-11-29 MED ORDER — LORATADINE 10 MG PO TABS
10.0000 mg | ORAL_TABLET | Freq: Every day | ORAL | Status: DC | PRN
  Administered 2017-11-29: 10 mg via ORAL
  Filled 2017-11-29: qty 1

## 2017-11-29 MED ORDER — PANTOPRAZOLE SODIUM 40 MG PO TBEC: 40.0000 mg | DELAYED_RELEASE_TABLET | Freq: Every day | ORAL | 0 refills | Status: AC

## 2017-11-29 MED ORDER — GI COCKTAIL ~~LOC~~: 30.0000 mL | Freq: Two times a day (BID) | ORAL | Status: DC | PRN

## 2017-11-29 NOTE — Progress Notes (Signed)
Received call from central Tele that patient was off telemetry. Patient took off his tele and got dressed and stated he is ready to go home. Dr. Edward Jolly notified and stated he is working on discharging patient within the next 1 hour. Dr. Edward Jolly is ok to leave patient off telemetry.

## 2017-11-29 NOTE — Discharge Summary (Signed)
Physician Discharge Summary  Patrick Logan  ZOX:[id number]  DOB: November 17, 1970  DOA: 11/27/2017 PCP: No primary care provider on file.  Admit date: 11/27/2017 Discharge date: 11/29/2017  Admitted From: Home  Disposition:  Home   Recommendations for Outpatient Follow-up:  1. Follow up with PCP in 1 week 2. Please obtain BMP/CBC in one week to monitor renal function and hgb  3. Please follow up on the following pending results: Final blood cultures result, so far negative   Discharge Condition: Stable   CODE STATUS: Full Code  Diet recommendation: Regular   Brief/Interim Summary: For full details see H&P/Progress note, but in brief, Patrick Logan is a 47 year old male with medical history significant for GERD and chronic pain presented to the emergency department complaining of abdominal pain, nausea, diarrhea and vomiting.  Upon ED evaluation patient was found to have elevated creatinine of 2.2, hemoglobin of 18.8 and leukocytosis of 19.3.  CT of the abdomen show changes consistent with diarrheal process.  Patient was admitted with working diagnosis of AKI due to dehydration.  Treated with IV fluids, initially started on Zosyn due to concern of infectious diarrhea however GI panel negative.  Patient clinically improved, diarrhea has resolved and tolerating oral diet well.  Abdominal pain has improved and patient was deemed stable for discharge and follow-up with primary care physician.  Subjective: Patient seen and examined, abdominal pain has improved.  Diarrhea stopped.  Denies nausea and vomiting.  Tolerating diet well.  Not feeling hungry.  No acute events overnight.  Afebrile.  Discharge Diagnoses/Hospital Course:  Diarrhea due to gastroenteritis - viral, resolved  Patient initially treated empirically with IV antibiotics GI panel was negative for infectious process and C. difficile.  Diarrhea has resolved patient now tolerating diet well.  Advised to use Imodium if neede  encourage oral hydration follow-up with PCP.  Unlikely to be sepsis, lab criteria consistent with dehydration.  CBC seems to be hemoconcentrated with hemoglobin of 18.8 and WBC of 19.3.  Tachycardia due to dehydration.  Therefore sepsis has been ruled out.  AKI - resolved  prerenal due to volume depletion in setting of diarrhea.  Creatinine has improved after IV hydration.  Encourage oral hydration.  Avoid nephrotoxic agents.  Check BMP in 1 week  Hepatic steatosis Incidental finding on CT abdomen pelvis with, LFTs are stable.  Advised on lifestyle modification.  Follow-up with PCP  On the day of the discharge the patient's vitals were stable, and no other acute medical condition were reported by patient. the patient was felt safe to be discharge to home.   Discharge Instructions  You were cared for by a hospitalist during your hospital stay. If you have any questions about your discharge medications or the care you received while you were in the hospital after you are discharged, you can call the unit and asked to speak with the hospitalist on call if the hospitalist that took care of you is not available. Once you are discharged, your primary care physician will handle any further medical issues. Please note that NO REFILLS for any discharge medications will be authorized once you are discharged, as it is imperative that you return to your primary care physician (or establish a relationship with a primary care physician if you do not have one) for your aftercare needs so that they can reassess your need for medications and monitor your lab values.  Discharge Instructions    Call MD for:  difficulty breathing, headache or visual disturbances  Complete by:  As directed    Call MD for:  extreme fatigue   Complete by:  As directed    Call MD for:  hives   Complete by:  As directed    Call MD for:  persistant dizziness or light-headedness   Complete by:  As directed    Call MD for:  persistant  nausea and vomiting   Complete by:  As directed    Call MD for:  redness, tenderness, or signs of infection (pain, swelling, redness, odor or green/yellow discharge around incision site)   Complete by:  As directed    Call MD for:  severe uncontrolled pain   Complete by:  As directed    Call MD for:  temperature >100.4   Complete by:  As directed    Diet - low sodium heart healthy   Complete by:  As directed    Increase activity slowly   Complete by:  As directed      Allergies as of 11/29/2017      Reactions   Aspirin Shortness Of Breath   REACTION: bronchospasm   Dust Mite Extract Shortness Of Breath   Ibuprofen Shortness Of Breath   REACTION: bronchospasm   Nsaids Shortness Of Breath   REACTION: bronchospasm   Tomato Shortness Of Breath      Medication List    STOP taking these medications   albuterol 108 (90 Base) MCG/ACT inhaler Commonly known as:  PROVENTIL HFA;VENTOLIN HFA   clonazePAM 1 MG tablet Commonly known as:  KLONOPIN   methocarbamol 500 MG tablet Commonly known as:  ROBAXIN   oxyCODONE 5 MG immediate release tablet Commonly known as:  Oxy IR/ROXICODONE     TAKE these medications   acetaminophen 500 MG tablet Commonly known as:  TYLENOL Take 2 tablets (1,000 mg total) by mouth every 6 (six) hours as needed.   pantoprazole 40 MG tablet Commonly known as:  PROTONIX Take 1 tablet (40 mg total) by mouth daily. Start taking on:  11/30/2017       Allergies  Allergen Reactions  . Aspirin Shortness Of Breath    REACTION: bronchospasm  . Dust Mite Extract Shortness Of Breath  . Ibuprofen Shortness Of Breath    REACTION: bronchospasm  . Nsaids Shortness Of Breath    REACTION: bronchospasm  . Tomato Shortness Of Breath    Consultations:   Procedures/Studies: Ct Abdomen Pelvis Wo Contrast  Result Date: 11/28/2017 CLINICAL DATA:  Abdominal distension. Nausea, vomiting and diarrhea for 2 days. EXAM: CT ABDOMEN AND PELVIS WITHOUT CONTRAST  TECHNIQUE: Multidetector CT imaging of the abdomen and pelvis was performed following the standard protocol without IV contrast. COMPARISON:  CT 03/09/2016, chest abdomen pelvis CT 03/07/2009 FINDINGS: Lower chest: 6 mm left lower lobe pulmonary nodule image 21 series 3. This is unchanged from abdominal CT 03/07/2009 and considered benign. No pleural fluid or consolidation. Hepatobiliary: Liver is enlarged spanning 21 cm cranial caudal. Diffusely decreased density consistent with steatosis. No discrete lesion on noncontrast exam. Mild gallbladder distention without calcified gallstone or pericholecystic inflammation. No biliary dilatation. Pancreas: No ductal dilatation or inflammation. Spleen: Normal in size without focal abnormality. Adrenals/Urinary Tract: No adrenal nodule. No hydronephrosis or perinephric edema. Punctate no ureteral calculi. Urinary bladder is partially distended without wall thickening. Nonobstructing stone in the upper right kidney. Stomach/Bowel: Small hiatal hernia. Stomach is nondistended. Small duodenal diverticulum. Administered enteric contrast reaches the mid small bowel. No bowel obstruction. No small bowel wall thickening or inflammation. Normal appendix.  Liquid stool throughout the colon with fluid levels. No colonic wall thickening or inflammatory change. Vascular/Lymphatic: Mild aorta bi-iliac atherosclerosis. No aneurysm. No enlarged abdominal or pelvic lymph nodes. Reproductive: Prostate is unremarkable. Other: No free air or free fluid. No intra-abdominal abscess. Fat in both inguinal canals, left greater than right. Tiny fat containing umbilical hernia. Musculoskeletal: There are no acute or suspicious osseous abnormalities. Degenerative disc disease and facet arthropathy at L5-S1. IMPRESSION: 1. Liquid stool throughout the colon consistent with diarrheal process. No bowel inflammation or wall thickening. 2. Incidental findings of hepatic steatosis and punctate nonobstructing  right renal stone. 3.  Aortic Atherosclerosis (ICD10-I70.0). Electronically Signed   By: Narda Rutherford M.D.   On: 11/28/2017 03:44    Discharge Exam: Vitals:   11/29/17 0433 11/29/17 1422  BP: (!) 150/97 130/75  Pulse: 75 66  Resp: 16 18  Temp: 97.7 F (36.5 C) 98.1 F (36.7 C)  SpO2: 98% 100%   Vitals:   11/28/17 1226 11/28/17 2115 11/29/17 0433 11/29/17 1422  BP: (!) 127/98 (!) 134/92 (!) 150/97 130/75  Pulse: 82 79 75 66  Resp: 18 18 16 18   Temp: 98.4 F (36.9 C) 99 F (37.2 C) 97.7 F (36.5 C) 98.1 F (36.7 C)  TempSrc: Oral  Oral Oral  SpO2: 97% 94% 98% 100%  Weight:      Height:        General: Pt is alert, awake, not in acute distress Cardiovascular: RRR, S1/S2 +, no rubs, no gallops Respiratory: CTA bilaterally, no wheezing, no rhonchi Abdominal: Soft, NT, ND, bowel sounds + Extremities: no edema, no cyanosis    The results of significant diagnostics from this hospitalization (including imaging, microbiology, ancillary and laboratory) are listed below for reference.     Microbiology: Recent Results (from the past 240 hour(s))  Culture, blood (routine x 2)     Status: None (Preliminary result)   Collection Time: 11/27/17 11:13 PM  Result Value Ref Range Status   Specimen Description   Final    BLOOD RIGHT ANTECUBITAL Performed at [facility], [address], [place], [place] [address]    Special Requests   Final    BOTTLES DRAWN AEROBIC AND ANAEROBIC Blood Culture adequate volume Performed at [facility], [address] [place], [place] [address]    Culture   Final    NO GROWTH 1 DAY Performed at [facility], [address] [place], [place] [address]    Report Status PENDING  Incomplete  Culture, blood (routine x 2)     Status: None (Preliminary result)   Collection Time: 11/27/17 11:25 PM  Result Value Ref Range Status   Specimen Description   Final    BLOOD LEFT ANTECUBITAL Performed at [facility], [address], [place], [place] [address]    Special Requests   Final    BOTTLES DRAWN AEROBIC AND ANAEROBIC Blood Culture adequate volume Performed at [facility], [address] [place], [place] [address]    Culture   Final    NO GROWTH 1 DAY Performed at [facility], [address] [place], [place] [address]    Report Status PENDING  Incomplete  Gastrointestinal Panel by PCR , Stool     Status: None   Collection Time: 11/28/17  2:16 AM  Result Value Ref Range Status   Campylobacter species NOT DETECTED NOT DETECTED Final   Plesimonas shigelloides NOT DETECTED NOT DETECTED Final  Salmonella species NOT DETECTED NOT DETECTED Final   Yersinia enterocolitica NOT DETECTED NOT DETECTED Final   Vibrio species NOT DETECTED NOT DETECTED Final   Vibrio cholerae NOT DETECTED NOT DETECTED Final   Enteroaggregative E coli (EAEC) NOT DETECTED NOT DETECTED Final   Enteropathogenic E coli (EPEC) NOT DETECTED NOT DETECTED Final   Enterotoxigenic E coli (ETEC) NOT DETECTED NOT DETECTED Final   Shiga like toxin producing E coli (STEC) NOT DETECTED NOT DETECTED Final   Shigella/Enteroinvasive E coli (EIEC) NOT DETECTED NOT DETECTED Final   Cryptosporidium NOT DETECTED NOT DETECTED Final   Cyclospora cayetanensis NOT DETECTED NOT DETECTED Final   Entamoeba histolytica NOT DETECTED NOT DETECTED Final   Giardia lamblia NOT DETECTED NOT DETECTED Final   Adenovirus F40/41 NOT DETECTED NOT DETECTED Final   Astrovirus NOT DETECTED NOT DETECTED Final   Norovirus GI/GII NOT DETECTED NOT DETECTED Final   Rotavirus A NOT DETECTED NOT DETECTED Final   Sapovirus (I, II, IV, and V) NOT DETECTED NOT DETECTED Final    Comment: Performed at [facility], [address] [place], [place] [address]  C difficile quick scan w PCR reflex     Status: None   Collection Time: 11/28/17  2:16 AM  Result Value Ref Range Status   C Diff antigen NEGATIVE  NEGATIVE Final   C Diff toxin NEGATIVE NEGATIVE Final   C Diff interpretation No C. difficile detected.  Final    Comment: Performed at [facility], [address], [place], [place] [address]     Labs: BNP (last 3 results) No results for input(s): BNP in the last 8760 hours. Basic Metabolic Panel: Recent Labs  Lab 11/27/17 2312 11/28/17 1334 11/29/17 0423  NA 138  --  140  K 3.6  --  3.9  CL 103  --  108  CO2 19*  --  22  GLUCOSE 134*  --  99  BUN 24*  --  23*  CREATININE 2.24* 1.42* 1.05  CALCIUM 10.4*  --  8.5*  MG  --   --  2.0   Liver Function Tests: Recent Labs  Lab 11/27/17 2312 11/29/17 0423  AST 26 18  ALT 29 21  ALKPHOS 120 79  BILITOT 0.8 0.6  PROT 9.9* 6.6  ALBUMIN 5.2* 3.3*   No results for input(s): LIPASE, AMYLASE in the last 168 hours. No results for input(s): AMMONIA in the last 168 hours. CBC: Recent Labs  Lab 11/27/17 2312 11/28/17 1334 11/29/17 0423  WBC 19.3* 9.8 8.0  NEUTROABS 15.1*  --   --   HGB 18.8* 14.9 13.9  HCT 54.4* 44.4 42.9  MCV 87.5 87.9 88.1  PLT 491* 407* 378   Cardiac Enzymes: Recent Labs  Lab 11/27/17 2312  TROPONINI <0.03   BNP: Invalid input(s): POCBNP CBG: No results for input(s): GLUCAP in the last 168 hours. D-Dimer No results for input(s): DDIMER in the last 72 hours. Hgb A1c No results for input(s): HGBA1C in the last 72 hours. Lipid Profile No results for input(s): CHOL, HDL, LDLCALC, TRIG, CHOLHDL, LDLDIRECT in the last 72 hours. Thyroid function studies No results for input(s): TSH, T4TOTAL, T3FREE, THYROIDAB in the last 72 hours.  Invalid input(s): FREET3 Anemia work up No results for input(s): VITAMINB12, FOLATE, FERRITIN, TIBC, IRON, RETICCTPCT in the last 72 hours. Urinalysis    Component Value Date/Time   COLORURINE YELLOW 03/09/2016 1250   APPEARANCEUR TURBID (A) 03/09/2016 1250   LABSPEC 1.025 03/09/2016 1250  PHURINE 5.0 03/09/2016 1250   GLUCOSEU NEGATIVE  03/09/2016 1250   HGBUR NEGATIVE 03/09/2016 1250   BILIRUBINUR NEGATIVE 03/09/2016 1250   BILIRUBINUR n 03/22/2015 1143   KETONESUR NEGATIVE 03/09/2016 1250   PROTEINUR NEGATIVE 03/09/2016 1250   UROBILINOGEN 0.2 03/22/2015 1143   UROBILINOGEN 0.2 12/23/2007 1919   NITRITE NEGATIVE 03/09/2016 1250   LEUKOCYTESUR NEGATIVE 03/09/2016 1250   Sepsis Labs Invalid input(s): PROCALCITONIN,  WBC,  LACTICIDVEN Microbiology Recent Results (from the past 240 hour(s))  Culture, blood (routine x 2)     Status: None (Preliminary result)   Collection Time: 11/27/17 11:13 PM  Result Value Ref Range Status   Specimen Description   Final    BLOOD RIGHT ANTECUBITAL Performed at [facility], [address], [place], [place] [address]    Special Requests   Final    BOTTLES DRAWN AEROBIC AND ANAEROBIC Blood Culture adequate volume Performed at [facility], [address] [place], [place] [address]    Culture   Final    NO GROWTH 1 DAY Performed at [facility], [address] [place], [place] [address]    Report Status PENDING  Incomplete  Culture, blood (routine x 2)     Status: None (Preliminary result)   Collection Time: 11/27/17 11:25 PM  Result Value Ref Range Status   Specimen Description   Final    BLOOD LEFT ANTECUBITAL Performed at [facility], [address], [place], [place] [address]    Special Requests   Final    BOTTLES DRAWN AEROBIC AND ANAEROBIC Blood Culture adequate volume Performed at [facility], [address], [place], [place] [address]    Culture   Final    NO GROWTH 1 DAY Performed at [facility], [address] [place], [place] [address]    Report Status PENDING  Incomplete  Gastrointestinal Panel by PCR , Stool     Status: None   Collection Time: 11/28/17  2:16 AM  Result Value Ref Range Status   Campylobacter species NOT DETECTED NOT DETECTED Final   Plesimonas  shigelloides NOT DETECTED NOT DETECTED Final   Salmonella species NOT DETECTED NOT DETECTED Final   Yersinia enterocolitica NOT DETECTED NOT DETECTED Final   Vibrio species NOT DETECTED NOT DETECTED Final   Vibrio cholerae NOT DETECTED NOT DETECTED Final   Enteroaggregative E coli (EAEC) NOT DETECTED NOT DETECTED Final   Enteropathogenic E coli (EPEC) NOT DETECTED NOT DETECTED Final   Enterotoxigenic E coli (ETEC) NOT DETECTED NOT DETECTED Final   Shiga like toxin producing E coli (STEC) NOT DETECTED NOT DETECTED Final   Shigella/Enteroinvasive E coli (EIEC) NOT DETECTED NOT DETECTED Final   Cryptosporidium NOT DETECTED NOT DETECTED Final   Cyclospora cayetanensis NOT DETECTED NOT DETECTED Final   Entamoeba histolytica NOT DETECTED NOT DETECTED Final   Giardia lamblia NOT DETECTED NOT DETECTED Final   Adenovirus F40/41 NOT DETECTED NOT DETECTED Final   Astrovirus NOT DETECTED NOT DETECTED Final   Norovirus GI/GII NOT DETECTED NOT DETECTED Final   Rotavirus A NOT DETECTED NOT DETECTED Final   Sapovirus (I, II, IV, and V) NOT DETECTED NOT DETECTED Final    Comment: Performed at [facility], [address] [place], [place] [address]  C difficile quick scan w PCR reflex     Status: None   Collection Time: 11/28/17  2:16 AM  Result Value Ref Range Status   C Diff antigen NEGATIVE NEGATIVE  Final   C Diff toxin NEGATIVE NEGATIVE Final   C Diff interpretation No C. difficile detected.  Final    Comment: Performed at [facility], [address], [place], [place] [address]     Time coordinating discharge: 25 minutes  SIGNED:  Latrelle Dodrill, MD  [facility] 11/29/2017, 3:05 PM  Pager please text page via  [email or web address]  Note - This record has been created using [employer]. Chart creation errors have been sought, but may not always have been located. Such creation errors do not reflect on the standard of medical care.

## 2017-12-03 LAB — CULTURE, BLOOD (ROUTINE X 2)
Culture: NO GROWTH
Culture: NO GROWTH
SPECIAL REQUESTS: ADEQUATE
SPECIAL REQUESTS: ADEQUATE

## 2019-06-04 ENCOUNTER — Ambulatory Visit: Payer: Medicaid Other | Attending: Internal Medicine

## 2019-06-04 DIAGNOSIS — Z23 Encounter for immunization: Secondary | ICD-10-CM

## 2019-06-04 NOTE — Progress Notes (Signed)
   Covid-19 Vaccination Clinic  Name:  Patrick Logan    MRN: [id number] DOB: [birth year]  06/04/2019  [name] was observed post Covid-19 immunization for 15 minutes without incident. He was provided with Vaccine Information Sheet and instruction to access the V-Safe system.   [name] was instructed to call 911 with any severe reactions post vaccine: Marland Kitchen Difficulty breathing  . Swelling of face and throat  . A fast heartbeat  . A bad rash all over body  . Dizziness and weakness   Immunizations Administered    Name Date Dose VIS Date Route   Pfizer COVID-19 Vaccine 06/04/2019 11:08 AM 0.3 mL 02/06/2019 Intramuscular   Manufacturer: [employer], [employer]   Lot: [id number]   NDC: [address]-4

## 2019-06-29 ENCOUNTER — Ambulatory Visit: Payer: Medicaid Other | Attending: Internal Medicine

## 2019-06-29 DIAGNOSIS — Z23 Encounter for immunization: Secondary | ICD-10-CM

## 2019-06-29 NOTE — Progress Notes (Signed)
   Covid-19 Vaccination Clinic  Name:  Patrick Logan    MRN: [id number] DOB: 09/26/1970  06/29/2019  Mr. Patrick Logan was observed post Covid-19 immunization for 30 minutes based on pre-vaccination screening without incident. He was provided with Vaccine Information Sheet and instruction to access the V-Safe system.   Mr. Patrick Logan was instructed to call 911 with any severe reactions post vaccine: Marland Kitchen Difficulty breathing  . Swelling of face and throat  . A fast heartbeat  . A bad rash all over body  . Dizziness and weakness   Immunizations Administered    Name Date Dose VIS Date Route   Pfizer COVID-19 Vaccine 06/29/2019 11:04 AM 0.3 mL 04/22/2018 Intramuscular   Manufacturer: [employer], [employer]   Lot: [id number]   NDC: [address]-2

## 2022-05-15 ENCOUNTER — Telehealth: Payer: Self-pay

## 2022-05-15 NOTE — Telephone Encounter (Signed)
Mychart msg sent
